# Patient Record
Sex: Female | Born: 1980 | Race: Black or African American | Hispanic: No | Marital: Single | Smoking: Current every day smoker
Health system: Southern US, Community
[De-identification: ages and names within clinical notes are randomized; demographics above are authoritative.]

## PROBLEM LIST (undated history)

## (undated) ENCOUNTER — Ambulatory Visit: Admission: EM | Payer: Self-pay

## (undated) DIAGNOSIS — M329 Systemic lupus erythematosus, unspecified: Secondary | ICD-10-CM

## (undated) DIAGNOSIS — IMO0002 Reserved for concepts with insufficient information to code with codable children: Secondary | ICD-10-CM

## (undated) DIAGNOSIS — F419 Anxiety disorder, unspecified: Secondary | ICD-10-CM

## (undated) DIAGNOSIS — F329 Major depressive disorder, single episode, unspecified: Secondary | ICD-10-CM

## (undated) DIAGNOSIS — F32A Depression, unspecified: Secondary | ICD-10-CM

## (undated) DIAGNOSIS — B009 Herpesviral infection, unspecified: Secondary | ICD-10-CM

## (undated) HISTORY — DX: Systemic lupus erythematosus, unspecified: M32.9

## (undated) HISTORY — DX: Herpesviral infection, unspecified: B00.9

## (undated) HISTORY — DX: Anxiety disorder, unspecified: F41.9

## (undated) HISTORY — PX: MANDIBLE SURGERY: SHX707

---

## 2007-04-23 ENCOUNTER — Emergency Department: Payer: Self-pay | Admitting: Unknown Physician Specialty

## 2008-12-28 DIAGNOSIS — F17210 Nicotine dependence, cigarettes, uncomplicated: Secondary | ICD-10-CM | POA: Insufficient documentation

## 2009-01-19 ENCOUNTER — Ambulatory Visit: Payer: Self-pay | Admitting: Otolaryngology

## 2009-01-25 ENCOUNTER — Ambulatory Visit: Payer: Self-pay | Admitting: Otolaryngology

## 2009-02-01 ENCOUNTER — Ambulatory Visit: Payer: Self-pay | Admitting: Otolaryngology

## 2009-03-19 DIAGNOSIS — R634 Abnormal weight loss: Secondary | ICD-10-CM | POA: Insufficient documentation

## 2009-06-05 ENCOUNTER — Emergency Department: Payer: Self-pay | Admitting: Emergency Medicine

## 2012-02-09 ENCOUNTER — Emergency Department: Payer: Self-pay | Admitting: Emergency Medicine

## 2012-05-24 ENCOUNTER — Emergency Department: Payer: Self-pay | Admitting: Emergency Medicine

## 2012-05-24 LAB — URINALYSIS, COMPLETE
Bacteria: NONE SEEN
Blood: NEGATIVE
Glucose,UR: NEGATIVE mg/dL (ref 0–75)
Nitrite: NEGATIVE
Protein: 30
RBC,UR: 1 /HPF (ref 0–5)
Specific Gravity: 1.02 (ref 1.003–1.030)

## 2012-07-26 ENCOUNTER — Emergency Department: Payer: Self-pay | Admitting: Emergency Medicine

## 2012-07-26 LAB — URINALYSIS, COMPLETE
Bilirubin,UR: NEGATIVE
Leukocyte Esterase: NEGATIVE
Nitrite: NEGATIVE
Ph: 5 (ref 4.5–8.0)
Squamous Epithelial: 3
WBC UR: 3 /HPF (ref 0–5)

## 2012-07-26 LAB — CBC
MCH: 26 pg (ref 26.0–34.0)
MCV: 81 fL (ref 80–100)
Platelet: 197 10*3/uL (ref 150–440)
RBC: 5.14 10*6/uL (ref 3.80–5.20)
WBC: 4.1 10*3/uL (ref 3.6–11.0)

## 2012-07-26 LAB — WET PREP, GENITAL

## 2013-11-28 DIAGNOSIS — L932 Other local lupus erythematosus: Secondary | ICD-10-CM | POA: Insufficient documentation

## 2013-12-31 DIAGNOSIS — F341 Dysthymic disorder: Secondary | ICD-10-CM | POA: Insufficient documentation

## 2013-12-31 LAB — HM PAP SMEAR

## 2016-07-08 ENCOUNTER — Ambulatory Visit
Admission: EM | Admit: 2016-07-08 | Discharge: 2016-07-08 | Disposition: A | Discharge status: Home / Self Care | Payer: Medicaid Other | Attending: Family Medicine | Admitting: Family Medicine

## 2016-07-08 DIAGNOSIS — J069 Acute upper respiratory infection, unspecified: Secondary | ICD-10-CM | POA: Diagnosis present

## 2016-07-08 DIAGNOSIS — F17210 Nicotine dependence, cigarettes, uncomplicated: Secondary | ICD-10-CM | POA: Insufficient documentation

## 2016-07-08 DIAGNOSIS — J029 Acute pharyngitis, unspecified: Secondary | ICD-10-CM | POA: Diagnosis not present

## 2016-07-08 DIAGNOSIS — Z79899 Other long term (current) drug therapy: Secondary | ICD-10-CM | POA: Diagnosis not present

## 2016-07-08 HISTORY — DX: Systemic lupus erythematosus, unspecified: M32.9

## 2016-07-08 HISTORY — DX: Reserved for concepts with insufficient information to code with codable children: IMO0002

## 2016-07-08 LAB — RAPID STREP SCREEN (MED CTR MEBANE ONLY): Streptococcus, Group A Screen (Direct): NEGATIVE

## 2016-07-08 MED ORDER — AZITHROMYCIN 250 MG PO TABS: ORAL_TABLET | ORAL | 0 refills | Status: DC

## 2016-07-08 MED ORDER — BENZONATATE 200 MG PO CAPS: 200.0000 mg | ORAL_CAPSULE | Freq: Three times a day (TID) | ORAL | 0 refills | Status: DC

## 2016-07-08 NOTE — ED Triage Notes (Signed)
Pt c/o sore throat for over a week, loss of voice, and congestion.

## 2016-07-08 NOTE — ED Provider Notes (Signed)
CSN: [id number]     Arrival date & time 07/08/16  0940 History   First MD Initiated Contact with Patient 07/08/16 1219     Chief Complaint  Patient presents with  . Sore Throat   (Consider location/radiation/quality/duration/timing/severity/associated sxs/prior Treatment) HPI  36 year old female since with a sore throat for 4 days week recent loss of her voice and congestion along with cough. For 2 weeks he's had chills cough preceeding the sore throat and loss of voice. She's had no nausea vomiting. She's had no fever. Is been using DayQuil. She is a smoker of 15 cigarettes daily.      Past Medical History:  Diagnosis Date  . Lupus    History reviewed. No pertinent surgical history. Family History  Problem Relation Age of Onset  . Cancer Mother   . Lupus Mother   . Thyroid disease Mother   . Hypertension Father    Social History  Substance Use Topics  . Smoking status: Current Every Day Smoker    Packs/day: 0.50    Types: Cigarettes  . Smokeless tobacco: Never Used  . Alcohol use Yes   OB History    No data available     Review of Systems  Constitutional: Positive for activity change, chills and fatigue. Negative for fever.  HENT: Positive for congestion, postnasal drip and sore throat.   Respiratory: Positive for cough. Negative for shortness of breath, wheezing and stridor.   All other systems reviewed and are negative.   Allergies  Patient has no known allergies.  Home Medications   Prior to Admission medications   Medication Sig Start Date End Date Taking? Authorizing Provider  cetirizine (ZYRTEC) 10 MG tablet Take 10 mg by mouth daily.   Yes Historical Provider, MD  mirtazapine (REMERON) 15 MG tablet Take 15 mg by mouth at bedtime.   Yes Historical Provider, MD  azithromycin (ZITHROMAX Z-PAK) 250 MG tablet Take per package instructions 07/08/16   Lutricia Feil, PA-C  benzonatate (TESSALON) 200 MG capsule Take 1 capsule (200 mg total) by mouth every 8  (eight) hours. 07/08/16   Lutricia Feil, PA-C   Meds Ordered and Administered this Visit  Medications - No data to display  BP (!) 134/92 (BP Location: Left Arm)   Pulse (!) 101   Temp 98.8 F (37.1 C) (Oral)   Resp 20   Ht 5\' 8"  (1.727 m)   Wt 115 lb (52.2 kg)   LMP 06/07/2016   SpO2 98%   BMI 17.49 kg/m  No data found.   Physical Exam  Constitutional: She is oriented to person, place, and time. She appears well-developed and well-nourished. No distress.  HENT:  Head: Normocephalic and atraumatic.  Right Ear: External ear normal.  Left Ear: External ear normal.  Nose: Nose normal.  Mouth/Throat: Oropharynx is clear and moist.  Eyes: EOM are normal. Pupils are equal, round, and reactive to light. Right eye exhibits no discharge. Left eye exhibits no discharge.  Neck: Normal range of motion. Neck supple.  Pulmonary/Chest: Effort normal and breath sounds normal. No respiratory distress. She has no wheezes. She has no rales.  Musculoskeletal: Normal range of motion.  Lymphadenopathy:    She has no cervical adenopathy.  Neurological: She is alert and oriented to person, place, and time.  Skin: Skin is warm and dry. She is not diaphoretic.  Psychiatric: She has a normal mood and affect. Her behavior is normal. Judgment and thought content normal.  Nursing note and vitals reviewed.  Urgent Care Course     Procedures (including critical care time)  Labs Review Labs Reviewed  RAPID STREP SCREEN (NOT AT [facility])  CULTURE, GROUP A STREP [facility])    Imaging Review No results found.   Visual Acuity Review  Right Eye Distance:   Left Eye Distance:   Bilateral Distance:    Right Eye Near:   Left Eye Near:    Bilateral Near:         MDM   1. Upper respiratory tract infection, unspecified type    Discharge Medication List as of 07/08/2016 12:28 PM    START taking these medications   Details  azithromycin (ZITHROMAX Z-PAK) 250 MG tablet Take per package  instructions, Normal    benzonatate (TESSALON) 200 MG capsule Take 1 capsule (200 mg total) by mouth every 8 (eight) hours., Starting Tue 07/08/2016, Normal      Plan: 1. Test/x-ray results and diagnosis reviewed with patient 2. rx as per orders; risks, benefits, potential side effects reviewed with patient 3. Recommend supportive treatment with Rest and fluids. This could possibly a viral illness but this has been lasting over 2 weeks it is worth trying an antibiotic. Follow-up with her primary care physician 4. F/u prn if symptoms worsen or don't improve     Lutricia FeilWilliam P Roemer, PA-C 07/08/16 1238

## 2016-07-11 LAB — CULTURE, GROUP A STREP (THRC)

## 2017-01-02 ENCOUNTER — Ambulatory Visit (INDEPENDENT_AMBULATORY_CARE_PROVIDER_SITE_OTHER): Payer: Self-pay

## 2017-01-02 ENCOUNTER — Ambulatory Visit
Admission: EM | Admit: 2017-01-02 | Discharge: 2017-01-02 | Disposition: A | Discharge status: Home / Self Care | Payer: Self-pay | Attending: Family Medicine | Admitting: Family Medicine

## 2017-01-02 ENCOUNTER — Encounter: Payer: Self-pay | Admitting: *Deleted

## 2017-01-02 DIAGNOSIS — F0781 Postconcussional syndrome: Secondary | ICD-10-CM

## 2017-01-02 DIAGNOSIS — S0083XA Contusion of other part of head, initial encounter: Secondary | ICD-10-CM

## 2017-01-02 DIAGNOSIS — S0003XA Contusion of scalp, initial encounter: Secondary | ICD-10-CM

## 2017-01-02 DIAGNOSIS — W19XXXA Unspecified fall, initial encounter: Secondary | ICD-10-CM

## 2017-01-02 MED ORDER — HYDROCODONE-ACETAMINOPHEN 5-325 MG PO TABS: ORAL_TABLET | ORAL | 0 refills | Status: DC

## 2017-01-02 NOTE — Discharge Instructions (Signed)
Ice, rest, over the counter tylenol or advil Go to Emergency Department if symptoms get worse

## 2017-01-02 NOTE — ED Triage Notes (Signed)
Patient fell at home 6 days ago injuring the left side of her head. Patient complains of dizziness, blurry vision and headache.

## 2017-01-02 NOTE — ED Provider Notes (Signed)
[facility]    CSN: [id number] Arrival date & time: 01/02/17  0847     History   Chief Complaint Chief Complaint  Patient presents with  . Headache    HPI Kelly Hampton is a 36 y.o. female.   36 yo female with a c/o left sided face pain after injuring it 6 days ago. Patient states she was playing with her children, tripped and fell, hitting the left side of her face, head. Denies loss of consciousness, vomiting. States since then she has had intermittent headaches, feeling "foggy". Has been going to work this week and taking over the counter ibuprofen.     The history is provided by the patient.    Past Medical History:  Diagnosis Date  . Lupus     There are no active problems to display for this patient.   History reviewed. No pertinent surgical history.  OB History    No data available       Home Medications    Prior to Admission medications   Medication Sig Start Date End Date Taking? Authorizing Provider  cetirizine (ZYRTEC) 10 MG tablet Take 10 mg by mouth daily.   Yes [provider]  mirtazapine (REMERON) 15 MG tablet Take 15 mg by mouth at bedtime.   Yes [provider]  azithromycin (ZITHROMAX Z-PAK) 250 MG tablet Take per package instructions 07/08/16   Lutricia Feil, PA-C  benzonatate (TESSALON) 200 MG capsule Take 1 capsule (200 mg total) by mouth every 8 (eight) hours. 07/08/16   Lutricia Feil, PA-C  HYDROcodone-acetaminophen (NORCO/VICODIN) 5-325 MG tablet 1-2 tabs po bid prn 01/02/17   Payton Mccallum, MD    Family History Family History  Problem Relation Age of Onset  . Cancer Mother   . Lupus Mother   . Thyroid disease Mother   . Hypertension Father     Social History Social History  Substance Use Topics  . Smoking status: Current Every Day Smoker    Packs/day: 0.50    Types: Cigarettes  . Smokeless tobacco: Never Used  . Alcohol use Yes     Allergies   Patient has no known  allergies.   Review of Systems Review of Systems   Physical Exam Triage Vital Signs ED Triage Vitals  Enc Vitals Group     BP 01/02/17 0905 (!) 120/91     Pulse Rate 01/02/17 0905 91     Resp 01/02/17 0905 16     Temp 01/02/17 0905 98.7 F (37.1 C)     Temp Source 01/02/17 0905 Oral     SpO2 01/02/17 0905 100 %     Weight --      Height --      Head Circumference --      Peak Flow --      Pain Score 01/02/17 0909 7     Pain Loc --      Pain Edu? --      Excl. in GC? --    No data found.   Updated Vital Signs BP (!) 120/91 (BP Location: Left Arm)   Pulse 91   Temp 98.7 F (37.1 C) (Oral)   Resp 16   LMP 12/19/2016 Comment: denies preg  SpO2 100%   Visual Acuity Right Eye Distance: 20/25 Left Eye Distance: 20/25 Bilateral Distance: 20/25  Right Eye Near:   Left Eye Near:    Bilateral Near:     Physical Exam  Constitutional: She is oriented to person, place,  and time. She appears well-developed and well-nourished. No distress.  HENT:  Head: Normocephalic and atraumatic.    Right Ear: Tympanic membrane, external ear and ear canal normal.  Left Ear: Tympanic membrane, external ear and ear canal normal.  Nose: No mucosal edema, rhinorrhea, nose lacerations, sinus tenderness, nasal deformity, septal deviation or nasal septal hematoma. No epistaxis.  No foreign bodies. Right sinus exhibits no maxillary sinus tenderness and no frontal sinus tenderness. Left sinus exhibits no maxillary sinus tenderness and no frontal sinus tenderness.  Mouth/Throat: Uvula is midline, oropharynx is clear and moist and mucous membranes are normal. No oropharyngeal exudate.  No skin lesions or deformities; tenderness to palpation over the left lateral zygomatic arch and jaw area  Eyes: Pupils are equal, round, and reactive to light. Conjunctivae and EOM are normal. Right eye exhibits no discharge. Left eye exhibits no discharge. No scleral icterus.  Neck: Normal range of motion. Neck  supple. No thyromegaly present.  Cardiovascular: Normal rate, regular rhythm and normal heart sounds.   Pulmonary/Chest: Effort normal and breath sounds normal. No respiratory distress. She has no wheezes. She has no rales.  Lymphadenopathy:    She has no cervical adenopathy.  Neurological: She is alert and oriented to person, place, and time. She displays normal reflexes. No cranial nerve deficit or sensory deficit. She exhibits normal muscle tone. Coordination normal.  Non-focal neurologic exam  Skin: She is not diaphoretic.  Psychiatric: She has a normal mood and affect. Her behavior is normal. Judgment and thought content normal.  Nursing note and vitals reviewed.    UC Treatments / Results  Labs (all labs ordered are listed, but only abnormal results are displayed) Labs Reviewed - No data to display  EKG  EKG Interpretation None       Radiology Dg Mandible 4 Views  Result Date: 01/02/2017 CLINICAL DATA:  Status post fall with injury to the left side of the face. EXAM: MANDIBLE - 4+ VIEW COMPARISON:  None. FINDINGS: There is no evidence of fracture or other focal bone lesions. Multiple metallic foreign bodies are seen overlying the face and head. IMPRESSION: Negative. Electronically Signed   By: Ted Mcalpineobrinka  Dimitrova M.D.   On: 01/02/2017 10:12   Dg Facial Bones Complete  Result Date: 01/02/2017 CLINICAL DATA:  Left-sided facial swelling, post fall. EXAM: FACIAL BONES COMPLETE 3+V COMPARISON:  None. FINDINGS: There is no evidence of fracture or other significant bone abnormality. No orbital emphysema or sinus air-fluid levels are seen. Multiple metallic foreign bodies overlie the films. IMPRESSION: Negative. Electronically Signed   By: Ted Mcalpineobrinka  Dimitrova M.D.   On: 01/02/2017 10:13    Procedures Procedures (including critical care time)  Medications Ordered in UC Medications - No data to display   Initial Impression / Assessment and Plan / UC Course  I have reviewed the  triage vital signs and the nursing notes.  Pertinent labs & imaging results that were available during my care of the patient were reviewed by me and considered in my medical decision making (see chart for details).       Final Clinical Impressions(s) / UC Diagnoses   Final diagnoses:  Facial contusion, initial encounter  Contusion of scalp, initial encounter  Post concussion syndrome    New Prescriptions Discharge Medication List as of 01/02/2017 10:33 AM    START taking these medications   Details  HYDROcodone-acetaminophen (NORCO/VICODIN) 5-325 MG tablet 1-2 tabs po bid prn, Print       1. x-ray results (negative) and diagnosis  reviewed with patient 2. rx as per orders above; reviewed possible side effects, interactions, risks and benefits  3. Recommend supportive treatment with otc analgesics prn 4. Follow-up prn if symptoms worsen or don't improve   Payton Mccallum, MD 01/02/17 1432

## 2017-09-26 ENCOUNTER — Other Ambulatory Visit: Payer: Self-pay

## 2017-09-26 ENCOUNTER — Ambulatory Visit
Admission: EM | Admit: 2017-09-26 | Discharge: 2017-09-26 | Disposition: A | Discharge status: Home / Self Care | Payer: Self-pay | Attending: Family Medicine | Admitting: Family Medicine

## 2017-09-26 DIAGNOSIS — F17210 Nicotine dependence, cigarettes, uncomplicated: Secondary | ICD-10-CM

## 2017-09-26 DIAGNOSIS — R102 Pelvic and perineal pain: Secondary | ICD-10-CM

## 2017-09-26 HISTORY — DX: Major depressive disorder, single episode, unspecified: F32.9

## 2017-09-26 HISTORY — DX: Depression, unspecified: F32.A

## 2017-09-26 LAB — URINALYSIS, COMPLETE (UACMP) WITH MICROSCOPIC
BACTERIA UA: NONE SEEN
Bilirubin Urine: NEGATIVE
Glucose, UA: NEGATIVE mg/dL
Ketones, ur: NEGATIVE mg/dL
Leukocytes, UA: NEGATIVE
Nitrite: NEGATIVE
PROTEIN: NEGATIVE mg/dL
Specific Gravity, Urine: <1.005 — ABNORMAL LOW (ref 1.005–1.030)
pH: 5.5 (ref 5.0–8.0)

## 2017-09-26 NOTE — ED Provider Notes (Signed)
[facility] ____________________________________________  Time seen: Approximately 10:00 AM  I have reviewed the triage vital signs and the nursing notes.   HISTORY  Chief Complaint Abdominal Pain   HPI Kelly Hampton is a 37 y.o. female presented for evaluation of acute onset of left lower pelvic pain that started earlier this morning.  Patient states that she goes into work at 5 AM, reports at 5:30 AM she began having left lower pain that has persisted.  States initially it was causing her to be doubled over in pain, states slightly better now but the pain has been constant.  States currently on menstrual cycle. States pain currently 7 out of 10.  Denies any pain radiation.  Last bowel movement was today and described as normal.  Denies any urinary frequency, urinary urgency, vaginal discharge, back pain, fevers, sore throat or rash.  States sexually active with the same partner.  States not highly concerned about STDs, but does have some concern.  Denies history of this similar in the past.  Reports does have 2 teenage children.  Denies concern of pregnancy.  States has Implanon.  Denies trauma or injury.  Does report that she does a lot of lifting at work, but denies any acute onset during lifting or change in activity levels.  Reports otherwise feels well. Denies recent sickness. Denies recent antibiotic use.   [facility]: PCP   Past Medical History:  Diagnosis Date  . Depression   . Lupus (HCC)     There are no active problems to display for this patient.   Past Surgical History:  Procedure Laterality Date  . MANDIBLE SURGERY       No current facility-administered medications for this encounter.   Current Outpatient Medications:  .  clobetasol cream (TEMOVATE) 0.05 %, Apply 1 application topically 2 (two) times daily., Disp: , Rfl:  .  etonogestrel (NEXPLANON) 68 MG IMPL implant, 1 each by Subdermal route once., Disp: , Rfl:  .   cetirizine (ZYRTEC) 10 MG tablet, Take 10 mg by mouth daily., Disp: , Rfl:  .  mirtazapine (REMERON) 15 MG tablet, Take 15 mg by mouth at bedtime., Disp: , Rfl:   Allergies Patient has no known allergies.  Family History  Problem Relation Age of Onset  . Cancer Mother   . Lupus Mother   . Thyroid disease Mother   . Hypertension Father     Social History Social History   Tobacco Use  . Smoking status: Current Every Day Smoker    Packs/day: 0.50    Types: Cigarettes  . Smokeless tobacco: Never Used  Substance Use Topics  . Alcohol use: Yes    Comment: social  . Drug use: Not Currently    Review of Systems Constitutional: No fever/chills Cardiovascular: Denies chest pain. Respiratory: Denies shortness of breath. Gastrointestinal: As above.  No nausea, no vomiting.  No diarrhea.  No constipation. Genitourinary: Negative for dysuria. Musculoskeletal: Negative for back pain. Skin: Negative for rash.  ____________________________________________   PHYSICAL EXAM:  VITAL SIGNS: ED Triage Vitals  Enc Vitals Group     BP 09/26/17 0914 123/89     Pulse Rate 09/26/17 0914 100     Resp 09/26/17 0914 16     Temp 09/26/17 0914 98.6 F (37 C)     Temp Source 09/26/17 0914 Oral     SpO2 09/26/17 0914 98 %     Weight 09/26/17 0915 111 lb (50.3 kg)     Height 09/26/17  0915 5\' 8"  (1.727 m)     Head Circumference --      Peak Flow --      Pain Score 09/26/17 0915 7     Pain Loc --      Pain Edu? --      Excl. in GC? --     Constitutional: Alert and oriented. Well appearing and in no acute distress. ENT      Head: Normocephalic and atraumatic. Cardiovascular: Normal rate, regular rhythm. Grossly normal heart sounds.  Good peripheral circulation. Respiratory: Normal respiratory effort without tachypnea nor retractions. Breath sounds are clear and equal bilaterally. No wheezes, rales, rhonchi. Gastrointestinal: No distention. Normal Bowel sounds. Moderate tenderness point  left pelvic adnexal area, abdomen otherwise soft and nontender.  No CVA tenderness. Musculoskeletal:  No midline cervical, thoracic or lumbar tenderness to palpation.  Neurologic:  Normal speech and language. No gross focal neurologic deficits are appreciated. Speech is normal. No gait instability.  Skin:  Skin is warm, dry and intact. No rash noted. Psychiatric: Mood and affect are normal. Speech and behavior are normal. Patient exhibits appropriate insight and judgment   ___________________________________________   LABS (all labs ordered are listed, but only abnormal results are displayed)  Labs Reviewed  URINALYSIS, COMPLETE (UACMP) WITH MICROSCOPIC - Abnormal; Notable for the following components:      Result Value   Specific Gravity, Urine <1.005 (*)    Hgb urine dipstick MODERATE (*)    Squamous Epithelial / LPF 0-5 (*)    All other components within normal limits     PROCEDURES Procedures  INITIAL IMPRESSION / ASSESSMENT AND PLAN / ED COURSE  Pertinent labs & imaging results that were available during my care of the patient were reviewed by me and considered in my medical decision making (see chart for details).  Well-appearing patient.  No acute distress.  Presenting for a left lower abdominal complaints, patient with point left pelvic tenderness, concern for adnexal etiology.  Discussed differentials including STDs, ovarian cyst as well as ovarian torsion.  Urinalysis reviewed, suspect contaminated from menstrual bleeding, otherwise unremarkable.  Discussed in detail with patient and recommend that patient needs to have further evaluated including a pelvic ultrasound.  No ultrasound availability at this facility at this time.  Recommend for patient to have further evaluation in emergency room, patient states that she [facility].  Patient stable at time of discharge.  Patient verbalized understanding and agreed to plan.    ____________________________________________   FINAL CLINICAL IMPRESSION(S) / ED DIAGNOSES  Final diagnoses:  Pelvic pain     ED Discharge Orders    None       Note: This dictation was prepared with Dragon dictation along with smaller phrase technology. Any transcriptional errors that result from this process are unintentional.         Renford Dills, NP 09/26/17 1020

## 2017-09-26 NOTE — ED Triage Notes (Addendum)
Pt started this left lower quad abdominal pain starting this a.m. No n/v/d. Pain 7/10. Started menses on Monday

## 2017-09-26 NOTE — Discharge Instructions (Addendum)
Go directly to Emergency room as discussed.  °

## 2019-01-11 ENCOUNTER — Ambulatory Visit: Payer: Self-pay

## 2019-01-17 ENCOUNTER — Other Ambulatory Visit: Payer: Self-pay

## 2019-01-17 ENCOUNTER — Ambulatory Visit: Payer: Self-pay | Admitting: Physician Assistant

## 2019-01-17 ENCOUNTER — Encounter: Payer: Self-pay | Admitting: Physician Assistant

## 2019-01-17 DIAGNOSIS — Z113 Encounter for screening for infections with a predominantly sexual mode of transmission: Secondary | ICD-10-CM

## 2019-01-17 DIAGNOSIS — B009 Herpesviral infection, unspecified: Secondary | ICD-10-CM

## 2019-01-17 LAB — WET PREP FOR TRICH, YEAST, CLUE
Trichomonas Exam: NEGATIVE
Yeast Exam: NEGATIVE

## 2019-01-17 LAB — HM HIV SCREENING LAB: HM HIV Screening: NEGATIVE

## 2019-01-17 MED ORDER — ACYCLOVIR 400 MG PO TABS: 400.0000 mg | ORAL_TABLET | Freq: Three times a day (TID) | ORAL | 0 refills | Status: DC

## 2019-01-17 NOTE — Progress Notes (Signed)
    STI clinic/screening visit  Subjective:  Kelly Hampton is a 38 y.o. female being seen today for an STI screening visit. Kelly patient reports they do not have symptoms.  Patient has Kelly following medical conditions:  There are no active problems to display for this patient.    Chief Complaint  Patient presents with  . SEXUALLY TRANSMITTED DISEASE    HPI  Patient reports that does not have any symptoms.  States that she found out that her partner has other partners.  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    See flowsheet for further details and programmatic requirements.    Kelly following portions of Kelly patient's history were reviewed and updated as appropriate: allergies, current medications, past medical history, past social history, past surgical history and problem list.  Objective:  There were no vitals filed for this visit.  Physical Exam Constitutional:      General: She is not in acute distress.    Appearance: Normal appearance.  HENT:     Head: Normocephalic and atraumatic.     Mouth/Throat:     Mouth: Mucous membranes are moist.     Pharynx: Oropharynx is clear. No oropharyngeal exudate or posterior oropharyngeal erythema.  Neck:     Musculoskeletal: Neck supple.  Pulmonary:     Effort: Pulmonary effort is normal.  Abdominal:     Palpations: Abdomen is soft. There is no mass.     Tenderness: There is no abdominal tenderness. There is no guarding or rebound.  Genitourinary:    General: Normal vulva.     Rectum: Normal.     Comments: External genitalia/pubic area without nits, lice, edema, erythema, lesions and inguinal adenopathy. Vaginal mucosa and discharge normal  Cervix without visible lesions. Uterus normal size, firm, mobile, nt, no CMT, no masses, no adnexal tenderness or fullness.  Lymphadenopathy:     Cervical: No cervical adenopathy.  Skin:    General: Skin is warm and dry.     Findings: No bruising, erythema, lesion or rash.   Neurological:     Mental Status: She is alert and oriented to person, place, and time.  Psychiatric:        Mood and Affect: Mood normal.        Behavior: Behavior normal.        Thought Content: Thought content normal.        Judgment: Judgment normal.       Assessment and Plan:  Lamonica Trueba is a 38 y.o. female presenting to [facility] Department for STI screening  1. Screening for STD (sexually transmitted disease) Patient is without symptoms today. Patient requests Acyclovir to have if she has an outbreak and given Acyclovir 400mg  #15 1 po tid for 5 days if needed Rec condoms with all sex Await test results.  Counseled that RN will call if needs to RTC for treatment once results are back.  - WET PREP FOR [place], YEAST, [place] [facility] - Syphilis Serology, [facility]     No follow-ups on file.  No future appointments.  Jerene Dilling, PA

## 2020-04-18 ENCOUNTER — Other Ambulatory Visit: Payer: Self-pay

## 2020-04-18 ENCOUNTER — Encounter: Payer: Self-pay | Admitting: Family Medicine

## 2020-04-18 ENCOUNTER — Ambulatory Visit: Payer: Self-pay | Admitting: Family Medicine

## 2020-04-18 VITALS — BP 129/93 | Ht 67.0 in | Wt 103.0 lb

## 2020-04-18 DIAGNOSIS — Z3009 Encounter for other general counseling and advice on contraception: Secondary | ICD-10-CM

## 2020-04-18 DIAGNOSIS — Z3046 Encounter for surveillance of implantable subdermal contraceptive: Secondary | ICD-10-CM

## 2020-04-18 DIAGNOSIS — B009 Herpesviral infection, unspecified: Secondary | ICD-10-CM

## 2020-04-18 NOTE — Progress Notes (Signed)
Pt is here for physical and Nexplanon removal and reinsertion. Pt reports last sex was 04/05/2020. Pt reports is satisfied with the Nexplanon as her BCM. Pt reports Nex[facility]on was placed at [facility] in 03/[facility]. RN counseling for Nexplanon removal and reinsertion completed, consult completed and consent forms reviewed and signed by pt. Pt filling out PHQ9.

## 2020-04-18 NOTE — Progress Notes (Signed)
ROI for Kelly Hampton reviewed and signed by pt for physical and pap records per provider verbal order. ROI faxed and fax confirmation received. Provider orders completed.

## 2020-04-19 MED ORDER — ACYCLOVIR 400 MG PO TABS: 400.0000 mg | ORAL_TABLET | Freq: Three times a day (TID) | ORAL | 11 refills | Status: DC

## 2020-04-19 MED ORDER — ETONOGESTREL 68 MG ~~LOC~~ IMPL: 68.0000 mg | DRUG_IMPLANT | Freq: Once | SUBCUTANEOUS | Status: DC

## 2020-04-19 MED ORDER — ETONOGESTREL 68 MG ~~LOC~~ IMPL
68.0000 mg | DRUG_IMPLANT | Freq: Once | SUBCUTANEOUS | Status: AC
  Administered 2020-04-19: 68 mg via SUBCUTANEOUS

## 2020-04-19 NOTE — Progress Notes (Signed)
[facility] DEPARTMENT [facility] [address] Main Number: [phone]    Family Planning Visit- Initial Visit  Subjective:  Kelly Hampton is a 39 y.o.  [id number]   being seen today for an initial well woman visit and to discuss family planning options.  She is currently using Nexplanon for pregnancy prevention. Patient reports she does not want a pregnancy in the next year.  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  Chief Complaint  Patient presents with  . Contraception    Physical and Nexplanon removal and reinsertion    Patient reports here for nexplanon removal and reinsertion   Patient denies any problems with previous device.  Patient   Body mass index is 16.13 kg/m. - Patient is eligible for diabetes screening based on BMI and age >31?  no HA1C ordered? not applicable  Patient reports 1 of partners in last year. Desires STI screening?  No - no s/sx or new partners since last screening   Has patient been screened once for HCV in the past?  Yes  No results found for: HCVAB  Does the patient have current drug use (including MJ), have a partner with drug use, and/or has been incarcerated since last result? No  If yes-- Screen for HCV through [facility]   Does the patient meet criteria for HBV testing? No  Criteria:  -Household, sexual or needle sharing contact with HBV -History of drug use -HIV positive -Those with known Hep C   Health Maintenance Due  Topic Date Due  . Hepatitis C Screening  Never done  . COVID-19 Vaccine (1) Never done  . TETANUS/TDAP  Never done  . PAP SMEAR-Modifier  Never done  . INFLUENZA VACCINE  Never done    Review of Systems  Skin:       Malar rash on right side of face and nose, hyperpigmented - from lupus   All other systems reviewed and are negative.   The following portions of the patient's history were reviewed and updated as  appropriate: allergies, current medications, past family history, past medical history, past social history, past surgical history and problem list. Problem list updated.   See flowsheet for other program required questions.  Objective:   Vitals:   04/18/20 1012  BP: (!) 129/93  Weight: 103 lb (46.7 kg)  Height: 5\' 7"  (1.702 m)    Physical Exam Vitals and nursing note reviewed. Exam conducted with a chaperone present.  Constitutional:      Appearance: Normal appearance.  HENT:     Head: Normocephalic.      Mouth/Throat:     Mouth: Mucous membranes are moist.     Pharynx: Oropharynx is clear. No oropharyngeal exudate or posterior oropharyngeal erythema.  Cardiovascular:     Rate and Rhythm: Normal rate and regular rhythm.     Pulses: Normal pulses.     Heart sounds: Normal heart sounds.  Pulmonary:     Effort: Pulmonary effort is normal.     Breath sounds: Normal breath sounds.  Abdominal:     General: Abdomen is flat.     Palpations: Abdomen is soft. There is no mass.     Tenderness: There is no abdominal tenderness. There is no guarding or rebound.     Hernia: No hernia is present.  Genitourinary:    Comments: External genitalia without, lice, nits, erythema, edema , lesions or inguinal adenopathy. Vagina with normal mucosa and discharge  and pH equals 4.  Cervix without visual lesions, uterus firm, mobile, non-tender, no masses, CMT adnexal fullness or tenderness.   Musculoskeletal:        General: Normal range of motion.     Cervical back: Normal range of motion and neck supple.  Lymphadenopathy:     Cervical: No cervical adenopathy.  Skin:    General: Skin is warm and dry.  Neurological:     Mental Status: She is alert and oriented to person, place, and time.  Psychiatric:        Mood and Affect: Mood normal.        Behavior: Behavior normal.       Assessment and Plan:  Kelly Hampton is a 39 y.o. female presenting to the [facility] Department  for an initial well woman exam/family planning visit  Contraception counseling: Reviewed all forms of birth control options in the tiered based approach. available including abstinence; over the counter/barrier methods; hormonal contraceptive medication including pill, patch, ring, injection,contraceptive implant, ECP; hormonal and nonhormonal IUDs; permanent sterilization options including vasectomy and the various tubal sterilization modalities. Risks, benefits, and typical effectiveness rates were reviewed.  Questions were answered.  Written information was also given to the patient to review.  Patient desires nexplanon, this was prescribed for patient. She will follow up in  1 year for surveillance.  She was told to call with any further questions, or with any concerns about this method of contraception.  Emphasized use of condoms 100% of the time for STI prevention.  Patient was offered ECP. ECP was not accepted by the patient. ECP counseling was not given - see RN documentation  1. HSV-2 infection  needs refill for acyclovir for episodic therapy.  Refill sent to [facility] in [place].      2. Family planning counseling - IGP, Aptima HPV Pap completed today awaiting results.  Patient to be informed if any problems or concerns.    Discussed with patient about returning to PCP at [facility] to follow up on her Lupus. Starting to have malar rash appear on right side of cheek and nose and to get back on medications, she reports stopped using facial cream when she ran out.     ROI to be sent to [facility]  for previous pap and PE   4. Encounter for removal and reinsertion of Nexplanon Nexplanon Removal and Insertion  Patient identified, informed consent performed, consent signed.   Patient does understand that irregular bleeding is a very common side effect of this medication. She was advised to have backup contraception for one week after replacement of the implant. Patient deemed to meet WHO criteria for  being reasonably certain she is not pregnant.  Appropriate time out taken. Nexplanon site identified. Area prepped in usual sterile fashon. 3 ml of 1% lidocaine with epinephrine was used to anesthetize the area at the distal end of the implant. A small stab incision was made right beside the implant on the distal portion. The Nexplanon rod was grasped using hemostats and removed without difficulty. There was minimal blood loss. There were no complications.   Confirmed correct location of insertion site. The insertion site was identified 8-10 cm (3-4 inches) from the medial epicondyle of the humerus and 3-5 cm (1.25-2 inches) posterior to (below) the sulcus (groove) between the biceps and triceps muscles of the patient's left arm. New Nexplanon removed from packaging, Device confirmed in needle, then inserted full length of needle and withdrawn per handbook instructions. Nexplanon  was able to palpated in the patient's left arm; patient palpated the insert herself.  There was minimal blood loss. Patient insertion site covered with guaze and a pressure bandage to reduce any bruising. The patient tolerated the procedure well and was given post procedure instructions.    Counseled patient to take OTC analgesic starting as soon as lidocaine starts to wear off and take regularly for at least 48 hr to decrease discomfort.  Specifically to take with food or milk to decrease stomach upset and for IB 600 mg (3 tablets) every 6 hrs; IB 800 mg (4 tablets) every 8 hrs; or Aleve 2 tablets every 12 hrs.     Return in 1 year (on 04/18/2021) for annual and PRN.  No future appointments.  Wendi Snipes, FNP

## 2020-04-21 LAB — IGP, APTIMA HPV
HPV Aptima: NEGATIVE
PAP Smear Comment: 0

## 2020-05-13 ENCOUNTER — Encounter: Payer: Self-pay | Admitting: Physician Assistant

## 2021-06-27 ENCOUNTER — Ambulatory Visit
Admission: RE | Admit: 2021-06-27 | Discharge: 2021-06-27 | Disposition: A | Discharge status: Home / Self Care | Payer: Worker's Compensation | Attending: Physician Assistant | Admitting: Physician Assistant

## 2021-06-27 ENCOUNTER — Other Ambulatory Visit: Payer: Self-pay | Admitting: Physician Assistant

## 2021-06-27 ENCOUNTER — Ambulatory Visit
Admission: RE | Admit: 2021-06-27 | Discharge: 2021-06-27 | Disposition: A | Discharge status: Home / Self Care | Payer: Worker's Compensation | Source: Ambulatory Visit | Attending: Physician Assistant | Admitting: Physician Assistant

## 2021-06-27 DIAGNOSIS — R52 Pain, unspecified: Secondary | ICD-10-CM

## 2021-06-27 DIAGNOSIS — R609 Edema, unspecified: Secondary | ICD-10-CM | POA: Insufficient documentation

## 2021-11-13 ENCOUNTER — Encounter: Payer: Self-pay | Admitting: Emergency Medicine

## 2021-11-13 ENCOUNTER — Ambulatory Visit
Admission: EM | Admit: 2021-11-13 | Discharge: 2021-11-13 | Disposition: A | Discharge status: Home / Self Care | Payer: Self-pay | Attending: Physician Assistant | Admitting: Physician Assistant

## 2021-11-13 DIAGNOSIS — M542 Cervicalgia: Secondary | ICD-10-CM

## 2021-11-13 DIAGNOSIS — R200 Anesthesia of skin: Secondary | ICD-10-CM

## 2021-11-13 MED ORDER — BACLOFEN 10 MG PO TABS: 10.0000 mg | ORAL_TABLET | Freq: Three times a day (TID) | ORAL | 0 refills | Status: DC | PRN

## 2021-11-13 NOTE — Discharge Instructions (Signed)
-  EKG without any significant concerns. - Blood pressure is a bit elevated. - Work on decreasing her dietary sodium and getting exercise. - Make an appointment with your primary care provider at [facility] for lab work and discuss potential referral to a specialist to help manage your lupus.  May also need to return to a dermatologist for help with the rash. - I think your neck pain and numbness today is related to pinched nerve in your neck and muscle spasms.  Continue ibuprofen and Tylenol.  Consider application of heat, ice, muscle rubs, lidocaine patch.  I have also sent a muscle relaxer. - You should be feeling better over the next 2 days but if you do not you may need to follow-up with [facility] for imaging of her neck. - Go to emergency department if your left arm becomes weak, you have significantly worsening neck pain or difficulty moving neck, severe headaches, vision changes, nausea/vomiting, balance or speech issues, confusion, chest pain, palpitations, shortness of breath, sweats or weakness.  NECK PAIN: Stressed avoiding painful activities. This can exacerbate your symptoms and make them worse.  May apply heat to the areas of pain for some relief. Use medications as directed. Be aware of which medications make you drowsy and do not drive or operate any kind of heavy machinery while using the medication (ie pain medications or muscle relaxers). F/U with PCP for reexamination or return sooner if condition worsens or does not begin to improve over the next few days.   NECK PAIN RED FLAGS: If symptoms get worse than they are right now, you should come back sooner for re-evaluation. If you have increased numbness/ tingling or notice that the numbness/tingling is affecting the legs or saddle region, go to ER. If you ever lose continence go to ER.

## 2021-11-13 NOTE — ED Provider Notes (Signed)
[facility]    CSN: [id number] Arrival date & time: 11/13/21  1006      History   Chief Complaint Chief Complaint  Patient presents with   Left Sided Numbness    HPI Kelly Hampton is a 41 y.o. female presenting for 2 to 3-day history of left lateral neck pain and numbness as well as pain and numbness radiating to the upper left shoulder.  She denies any injuries but is very physical at her job.  Patient denies any associated weakness.  Has not had any headaches, vision changes, facial numbness or droop, syncope or presyncope, confusion, speech or balance problems, chest pain, palpitations, shortness of breath.  No lower extremity numbness, weakness or tingling.  Patient denies any history of neck problems.  She reports increased pain when she rotates her neck to the right side.  She says it feels very tight and cramping on the left side.  Medical history significant for lupus.  Patient also is a current every day smoker.  She denies any personal history of heart attack or stroke.  Patient's partner is present and confirms she is acting like her normal self.  She has tried ibuprofen and Tylenol and says that it does help the neck discomfort.  HPI  Past Medical History:  Diagnosis Date   Depression    Herpes simplex virus (HSV) infection    Lupus ([place])    Systemic lupus erythematosus ([place])     Patient Active Problem List   Diagnosis Date Noted   HSV-2 infection 01/17/2019    Past Surgical History:  Procedure Laterality Date   MANDIBLE SURGERY      OB History     Gravida  5   Para  2   Term  2   Preterm      AB  3   Living  2      SAB      IAB      Ectopic      Multiple      Live Births               Home Medications    Prior to Admission medications   Medication Sig Start Date End Date Taking? Authorizing Provider  baclofen (LIORESAL) 10 MG tablet Take 1 tablet (10 mg total) by mouth 3 (three) times daily as needed for muscle  spasms. 11/13/21  Yes Danton Clap, PA-C  etonogestrel (NEXPLANON) 68 MG IMPL implant 1 each by Subdermal route once.   Yes [provider]  acyclovir (ZOVIRAX) 400 MG tablet Take 1 tablet (400 mg total) by mouth 3 (three) times daily. 04/19/20   Junious Dresser, FNP  cetirizine (ZYRTEC) 10 MG tablet Take 10 mg by mouth daily.    [provider]  clobetasol cream (TEMOVATE) [id number] % Apply 1 application topically 2 (two) times daily.    [provider]  mirtazapine (REMERON) 15 MG tablet Take 15 mg by mouth at bedtime.    [provider]    Family History Family History  Problem Relation Age of Onset   Cancer Mother    Lupus Mother    Thyroid disease Mother    Hypertension Father    Heart murmur Father    Anxiety disorder Sister    Sickle cell trait Niece     Social History Social History   Tobacco Use   Smoking status: Every Day    Packs/day: 0.50    Types: Cigarettes   Smokeless tobacco: Never  Substance Use Topics   Alcohol use: Yes    Comment: social   Drug use: Not Currently     Allergies   Patient has no known allergies.   Review of Systems Review of Systems  Constitutional:  Negative for fatigue.  Eyes:  Negative for visual disturbance.  Respiratory:  Negative for shortness of breath.   Cardiovascular:  Negative for chest pain and palpitations.  Gastrointestinal:  Negative for abdominal pain, nausea and vomiting.  Musculoskeletal:  Positive for neck pain and neck stiffness. Negative for back pain.  Neurological:  Positive for numbness. Negative for dizziness, tremors, seizures, syncope, facial asymmetry, speech difficulty, weakness, light-headedness and headaches.  Psychiatric/Behavioral:  Negative for confusion.     Physical Exam Triage Vital Signs ED Triage Vitals  Enc Vitals Group     BP      Pulse      Resp      Temp      Temp src      SpO2      Weight      Height      Head Circumference      Peak Flow       Pain Score      Pain Loc      Pain Edu?      Excl. in [place]?    No data found.  Updated Vital Signs BP (!) 143/103 (BP Location: Left Arm)   Pulse 93   Temp 98.2 F (36.8 C) (Oral)   Resp 18   Ht 5\' 8"  (1.727 m)   Wt 113 lb (51.3 kg)   SpO2 100%   BMI 17.18 kg/m      Physical Exam Vitals and nursing note reviewed.  Constitutional:      General: She is not in acute distress.    Appearance: Normal appearance. She is not ill-appearing or toxic-appearing.  HENT:     Head: Normocephalic and atraumatic.     Nose: Nose normal.     Mouth/Throat:     Mouth: Mucous membranes are moist.     Pharynx: Oropharynx is clear.  Eyes:     General: No scleral icterus.       Right eye: No discharge.        Left eye: No discharge.     Extraocular Movements: Extraocular movements intact.     Conjunctiva/sclera: Conjunctivae normal.     Pupils: Pupils are equal, round, and reactive to light.  Cardiovascular:     Rate and Rhythm: Normal rate and regular rhythm.     Pulses: Normal pulses.     Heart sounds: Normal heart sounds.  Pulmonary:     Effort: Pulmonary effort is normal. No respiratory distress.     Breath sounds: Normal breath sounds. No wheezing, rhonchi or rales.  Musculoskeletal:     Cervical back: Neck supple. Tenderness (left SCM) present. No bony tenderness. Pain with movement present. Decreased range of motion (with rotation to the left).     Comments: Full ROM of shoulder on left. 5/5 strength bilateral upper and lower extremities  Skin:    General: Skin is dry.  Neurological:     General: No focal deficit present.     Mental Status: She is alert and oriented to person, place, and time. Mental status is at baseline.     Cranial Nerves: No cranial nerve deficit.     Motor: No weakness.     Coordination: Coordination normal.     Gait: Gait normal.  Psychiatric:        Mood and Affect: Mood normal.        Behavior: Behavior normal.        Thought Content: Thought content  normal.     UC Treatments / Results  Labs (all labs ordered are listed, but only abnormal results are displayed) Labs Reviewed - No data to display  EKG   Radiology No results found.  Procedures ED EKG  Date/Time: 11/13/2021 12:03 PM Performed by: Danton Clap, PA-C Authorized by: Danton Clap, PA-C   ECG reviewed by ED Physician in the absence of a cardiologist: yes   Previous ECG:    Previous ECG:  Unavailable Interpretation:    Interpretation: abnormal   Rate:    ECG rate:  81   ECG rate assessment: normal   Rhythm:    Rhythm: sinus rhythm   Ectopy:    Ectopy: none   QRS:    QRS axis:  Normal   QRS intervals:  Normal   QRS conduction: normal   ST segments:    ST segments:  Normal T waves:    T waves: non-specific   Comments:     Normal sinus rhythm.  Nonspecific T wave changes (including critical care time)  Medications Ordered in UC Medications - No data to display  Initial Impression / Assessment and Plan / UC Course  I have reviewed the triage vital signs and the nursing notes.  Pertinent labs & imaging results that were available during my care of the patient were reviewed by me and considered in my medical decision making (see chart for details).  41 year old female with history of SLE presents to urgent care for 2 to 3-day history of left anterior/lateral neck pain and numbness with radiation to the top of the left shoulder.  No reported injuries but patient is very physical at her job.  She denies any associated severe headaches, vision changes, speech or balance problems, confusion, numbness/tingling/weakness of any other part of the body.  She also has not had any facial drooping.  Denies chest pain, palpitations or breathing difficulty.  No history of heart attack or stroke.  Has been taking ibuprofen and Tylenol which have helped some.  Initial BP is 157/105. BP recheck is 143/103.  Patient denies history of hypertension but reports she does  not have a PCP.  Has not been seen in some time.  Does not take anything for her SLE other than topical corticosteroids for butterfly rash.  The remainder of vital signs are normal and stable.  She is overall well-appearing and in good spirits.  On exam she does have tenderness palpation along the SCM.  Normal cranial nerve exam and 5 out of 5 strength bilateral upper and lower extremities.  Slightly reduced range of motion of the neck with rotation to the right as this causes her pain.  EKG performed today shows normal sinus rhythm and regular rate of 81 bpm.  Nonspecific T wave changes, inversion of T waves in V2, V4 and aVL.  Patient's clinical presentation consistent with muscle spasms and suspected pinched nerve/cervical radiculopathy.  Discussed with patient that there were some mild abnormalities in her EKG.  She is denying any associated chest pain, palpitations or breathing difficulty and symptoms have not worsened over the past couple of days.  Also denies any signs/symptoms of stroke and exam is overall reassuring.  She has tenderness palpation along the left side of the neck where she is experiencing the numbness.  Full range of motion of extremities and 5 out of 5 strength of bilateral upper and lower extremities.  Normal cranial nerve exam.  Chest clear to auscultation heart regular rate and rhythm.  We will treat patient at this time with NSAIDs, Tylenol, lidocaine patches, heat, ice and also provide her with baclofen.  Work note provided.  Thoroughly reviewed ED precautions with her and advised her to go to the ER if she has left arm weakness or worsening pain or difficulty moving neck, severe headaches, vision changes, nausea/vomiting, balance or speech problems, confusion, chest pains, palpitations, shortness of breath, sweats or weakness.  Patient agrees to plan.  Also advised her to follow-up with [facility] for a physical exam.   Final Clinical Impressions(s) / UC Diagnoses   Final  diagnoses:  Neck pain  Left arm numbness     Discharge Instructions      -EKG without any significant concerns. - Blood pressure is a bit elevated. - Work on decreasing her dietary sodium and getting exercise. - Make an appointment with your primary care provider at [facility] for lab work and discuss potential referral to a specialist to help manage your lupus.  May also need to return to a dermatologist for help with the rash. - I think your neck pain and numbness today is related to pinched nerve in your neck and muscle spasms.  Continue ibuprofen and Tylenol.  Consider application of heat, ice, muscle rubs, lidocaine patch.  I have also sent a muscle relaxer. - You should be feeling better over the next 2 days but if you do not you may need to follow-up with [facility] for imaging of her neck. - Go to emergency department if your left arm becomes weak, you have significantly worsening neck pain or difficulty moving neck, severe headaches, vision changes, nausea/vomiting, balance or speech issues, confusion, chest pain, palpitations, shortness of breath, sweats or weakness.  NECK PAIN: Stressed avoiding painful activities. This can exacerbate your symptoms and make them worse.  May apply heat to the areas of pain for some relief. Use medications as directed. Be aware of which medications make you drowsy and do not drive or operate any kind of heavy machinery while using the medication (ie pain medications or muscle relaxers). F/U with PCP for reexamination or return sooner if condition worsens or does not begin to improve over the next few days.   NECK PAIN RED FLAGS: If symptoms get worse than they are right now, you should come back sooner for re-evaluation. If you have increased numbness/ tingling or notice that the numbness/tingling is affecting the legs or saddle region, go to ER. If you ever lose continence go to ER.         ED Prescriptions     Medication Sig Dispense Auth.  Provider   baclofen (LIORESAL) 10 MG tablet Take 1 tablet (10 mg total) by mouth 3 (three) times daily as needed for muscle spasms. 30 each Gretta Cool      PDMP not reviewed this encounter.   Danton Clap, PA-C 11/13/21 1207

## 2021-11-13 NOTE — ED Notes (Incomplete)
Patient presents to Urgent Care with complaints of numbness to left side of neck to shoulder area since Sunday at 2100. Pt unsure if she slept on her arm wrong. Pt c/o of nausea and vomitign since Sunday. Treating symptoms with pepto.   Denies chest pain, SOB, changes in vision, speech, or changes in gait.

## 2021-11-13 NOTE — ED Triage Notes (Signed)
Patient presents to [facility] for numbness to her left neck and shoulder area since Sunday at about 2200. She states she is unsure if she slept on it wrong. Also c/o of n/v since Sunday. Treating symptoms with OTC tums.   Denies changes in gait, vision, speech, chest pain, SOB.

## 2021-11-13 NOTE — ED Triage Notes (Signed)
Patient c/o left sided facial and left sided numbness x 2 days.  No history of HTN, no blurred vision, no chest pain.

## 2022-04-11 ENCOUNTER — Ambulatory Visit (INDEPENDENT_AMBULATORY_CARE_PROVIDER_SITE_OTHER): Payer: No Typology Code available for payment source

## 2022-04-11 ENCOUNTER — Encounter: Payer: Self-pay | Admitting: Emergency Medicine

## 2022-04-11 ENCOUNTER — Ambulatory Visit
Admission: EM | Admit: 2022-04-11 | Discharge: 2022-04-11 | Disposition: A | Discharge status: Home / Self Care | Payer: No Typology Code available for payment source | Attending: Emergency Medicine | Admitting: Emergency Medicine

## 2022-04-11 DIAGNOSIS — M25521 Pain in right elbow: Secondary | ICD-10-CM

## 2022-04-11 DIAGNOSIS — J309 Allergic rhinitis, unspecified: Secondary | ICD-10-CM | POA: Insufficient documentation

## 2022-04-11 DIAGNOSIS — M25421 Effusion, right elbow: Secondary | ICD-10-CM | POA: Diagnosis present

## 2022-04-11 LAB — BASIC METABOLIC PANEL
Anion gap: 4 — ABNORMAL LOW (ref 5–15)
BUN: 8 mg/dL (ref 6–20)
CO2: 27 mmol/L (ref 22–32)
Calcium: 8.8 mg/dL — ABNORMAL LOW (ref 8.9–10.3)
Chloride: 106 mmol/L (ref 98–111)
Creatinine, Ser: 0.67 mg/dL (ref 0.44–1.00)
GFR, Estimated: >60 mL/min (ref >60)
Glucose, Bld: 96 mg/dL (ref 70–99)
Potassium: 4 mmol/L (ref 3.5–5.1)
Sodium: 137 mmol/L (ref 135–145)

## 2022-04-11 LAB — SEDIMENTATION RATE: Sed Rate: 3 mm/hr (ref 0–20)

## 2022-04-11 MED ORDER — FLUTICASONE PROPIONATE 50 MCG/ACT NA SUSP: 2.0000 | Freq: Every day | NASAL | 0 refills | Status: DC

## 2022-04-11 MED ORDER — CETIRIZINE HCL 10 MG PO TABS: 10.0000 mg | ORAL_TABLET | Freq: Every day | ORAL | 0 refills | Status: DC

## 2022-04-11 MED ORDER — AMOXICILLIN-POT CLAVULANATE 875-125 MG PO TABS: 1.0000 | ORAL_TABLET | Freq: Two times a day (BID) | ORAL | 0 refills | Status: DC

## 2022-04-11 NOTE — ED Triage Notes (Signed)
Pt c/o nasal congestion, post nasal drip, sinus pain/pressure and headache. Started back in April. She has tried allergy medication. She also c/o right elbow pain and swelling. Started about a year ago. She states the cant use it like she wants to.

## 2022-04-11 NOTE — Discharge Instructions (Addendum)
Schedule appt with [facility] practice Office [phone]    Follow-up with Dr. Zigmund Daniel, sports medicine, ASAP.  I have put in an urgent referral to him.  I will contact you if and only if your sedimentation rate comes back abnormal.  You can try an Ace wrap on your elbow and take 400 mg of ibuprofen combined with 1000 mg of Tylenol 3 times a day.  If you are unable to see Dr. Zigmund Daniel in a timely fashion, follow-up with [facility].  Continue the Zyrtec and finish the Augmentin.  Use a NeilMed sinus rinse with distilled water as often as you want to to reduce nasal congestion. Follow the directions on the box.   Go to www.goodrx.com to look up your medications. This will give you a list of where you can find your prescriptions at the most affordable prices. Or you can ask the pharmacist what the cash price is. This is frequently cheaper than going through insurance.

## 2022-04-11 NOTE — ED Provider Notes (Signed)
HPI  SUBJECTIVE:  Kelly Hampton is a right-handed 41 y.o. female who presents with 2 issues: First, she reports right elbow pain for 1 to 1-1/2 years.  She reports intermittent elbow swelling.  She states that she cannot use her elbow properly secondary to the pain.  It has not changed over the past few weeks.  No recent trauma, joint erythema, distal numbness or tingling.  She states that her shoulder and wrist are fine.  She has tried elevation, exercise and rest.  Symptoms are better with movement, worse in the morning and with full extension/flexion beyond 90 degrees.  Second, she reports 2 months of maxillary sinus pain and pressure, postnasal drip, allergy symptoms of itchy, watery eyes, sneezing, nasal congestion worse in the morning.  She reports 2 days of a sinus headache.  She is exposed to dust at work.  She has been taking Benadryl 50 mg 3 times a day with improvement in her symptoms.  She also states that Zyrtec and Flonase help.  There are no aggravating factors.  She has a past medical history of lupus, denies renal or pulmonary involvement.  No history of rheumatoid arthritis, osteoarthritis, right elbow injury, diabetes, hypertension.  Family history significant for grandmother with rheumatoid arthritis.  LMP: She is irregular.  Has a Nexplanon.  Denies possibility of being pregnant.  PCP: None.   Past Medical History:  Diagnosis Date   Depression    Herpes simplex virus (HSV) infection    Lupus ([place])    Systemic lupus erythematosus ([place])     Past Surgical History:  Procedure Laterality Date   MANDIBLE SURGERY      Family History  Problem Relation Age of Onset   Cancer Mother    Lupus Mother    Thyroid disease Mother    Hypertension Father    Heart murmur Father    Anxiety disorder Sister    Sickle cell trait Niece     Social History   Tobacco Use   Smoking status: Every Day    Packs/day: 0.50    Types: Cigarettes   Smokeless tobacco: Never  Vaping Use    Vaping Use: Never used  Substance Use Topics   Alcohol use: Yes    Comment: social   Drug use: Not Currently    No current facility-administered medications for this encounter.  Current Outpatient Medications:    amoxicillin-clavulanate (AUGMENTIN) 875-125 MG tablet, Take 1 tablet by mouth every 12 (twelve) hours., Disp: 14 tablet, Rfl: 0   cetirizine (ZYRTEC ALLERGY) 10 MG tablet, Take 1 tablet (10 mg total) by mouth daily., Disp: 30 tablet, Rfl: 0   etonogestrel (NEXPLANON) 68 MG IMPL implant, 1 each by Subdermal route once., Disp: , Rfl:    fluticasone (FLONASE) 50 MCG/ACT nasal spray, Place 2 sprays into both nostrils daily., Disp: 16 g, Rfl: 0   acyclovir (ZOVIRAX) 400 MG tablet, Take 1 tablet (400 mg total) by mouth 3 (three) times daily., Disp: 15 tablet, Rfl: 11   baclofen (LIORESAL) 10 MG tablet, Take 1 tablet (10 mg total) by mouth 3 (three) times daily as needed for muscle spasms., Disp: 30 each, Rfl: 0   clobetasol cream (TEMOVATE) 7.67 %, Apply 1 application topically 2 (two) times daily., Disp: , Rfl:    mirtazapine (REMERON) 15 MG tablet, Take 15 mg by mouth at bedtime., Disp: , Rfl:   No Known Allergies   ROS  As noted in HPI.   Physical Exam  BP (!) 119/90 (BP Location: Left  Arm)   Pulse (!) 107   Temp 98.4 F (36.9 C) (Oral)   Resp 16   Ht 5\' 8"  (1.727 m)   Wt 51.3 kg   SpO2 100%   BMI 17.20 kg/m   Constitutional: Well developed, well nourished, no acute distress Eyes:  EOMI, conjunctiva normal bilaterally HENT: Normocephalic, atraumatic,mucus membranes moist.  Erythematous, swollen turbinates.  Mucoid nasal discharge.  Positive mild maxillary sinus tenderness.  No obvious postnasal drip. Respiratory: Normal inspiratory effort Cardiovascular: Normal rate GI: nondistended skin: No rash, skin intact Musculoskeletal: Right elbow ROM slightly decreased, unable to fully extend / Unable to flex to 90 degrees, joint slightly swollen, no erythema, no bursa  swelling, supracondylar region tender, Radial head NT , Olecrenon process  tender, Medial epicondyle NT, Lateral epicondyle NT,  Shoulder NT, Wrist NT, Hand NT with distal NVI CR<2secs, radial pulse intact, Sensation LT and Motor intact distally in distribution of radial, median, and ulnar nerve function.  Neurologic: Alert & oriented x 3, no focal neuro deficits Psychiatric: Speech and behavior appropriate   ED Course   Medications - No data to display  Orders Placed This Encounter  Procedures   DG Elbow Complete Right    Standing Status:   Standing    Number of Occurrences:   1    Order Specific Question:   Reason for Exam (SYMPTOM  OR DIAGNOSIS REQUIRED)    Answer:   R elbow pain swelling x 1 year r/o acute changes, effusion   Basic metabolic panel    Standing Status:   Standing    Number of Occurrences:   1   Sedimentation rate    Standing Status:   Standing    Number of Occurrences:   1   AMB referral to sports medicine    Referral Priority:   Urgent    Referral Type:   Consultation    Referred to Provider:   Montel Culver, MD    Number of Visits Requested:   1   Nursing Communication Please set up with a PCP prior to discharge    Please set up with a PCP prior to discharge    Standing Status:   Standing    Number of Occurrences:   1    Results for orders placed or performed during the hospital encounter of 04/11/22 (from the past 24 hour(s))  Basic metabolic panel     Status: Abnormal   Collection Time: 04/11/22  2:26 PM  Result Value Ref Range   Sodium 137 135 - 145 mmol/L   Potassium 4.0 3.5 - 5.1 mmol/L   Chloride 106 98 - 111 mmol/L   CO2 27 22 - 32 mmol/L   Glucose, Bld 96 70 - 99 mg/dL   BUN 8 6 - 20 mg/dL   Creatinine, Ser 0.67 0.44 - 1.00 mg/dL   Calcium 8.8 (L) 8.9 - 10.3 mg/dL   GFR, Estimated >60 >60 mL/min   Anion gap 4 (L) 5 - 15  Sedimentation rate     Status: None   Collection Time: 04/11/22  2:26 PM  Result Value Ref Range   Sed Rate 3 0 -  20 mm/hr   DG Elbow Complete Right  Result Date: 04/11/2022 CLINICAL DATA:  Right elbow pain and swelling for 1 year. Evaluate for acute changes. Evaluate for effusion. EXAM: RIGHT ELBOW - COMPLETE 3+ VIEW COMPARISON:  None Available. FINDINGS: Normal bone mineralization. There is elevation of the distal anterior humeral fat pad and there is  visualization of the posterior fat pad indicating a moderate to high-grade joint effusion. No definite acute fracture is seen. No dislocation. IMPRESSION: Moderate to high-grade elbow joint effusion. No definite acute fracture is seen. Per the indication, it does not appear the patient has had recent trauma. Recommend clinical correlation as to whether there is clinical concern for a radiographically occult fracture. Electronically Signed   By: Yvonne Kendall M.D.   On: 04/11/2022 14:38    ED Clinical Impression  1. Elbow effusion, right   2. Allergic sinusitis      ED Assessment/Plan    1.  Chronic right elbow pain.  Patient states her elbow has been swollen for several months.  Will x-ray to rule out any acute changes.  We will check BMP and a sed rate given history of lupus.  In the differential is autoimmune arthritis, osteoarthritis, overuse, gout.  Doubt septic joint, gout given duration of symptoms.  It does not appear to be a bursitis.  Reviewed imaging independently.  moderate high-grade elbow joint effusion..  See radiology report for details.  Concern for autoimmune arthritis such as rheumatoid arthritis.  I arranged an appointment with Dr. Rosette Reveal, sports medicine on Monday, 10/30 at 11 AM.  Discussed this with patient and wrote this down on her discharge papers.  2.  Allergic rhinitis/maxillary sinusitis.  We will send home with Zyrtec 10 mg daily, saline nasal irrigation, Flonase, 2 actuations each nostril daily, and Augmentin 875 mg twice daily for 7 days.  She is to follow-up with family practice in [place].  We have arranged an  appointment prior to discharge.  Discussed labs, imaging, MDM, treatment plan, and plan for follow-up with patient.  patient agrees with plan.   Meds ordered this encounter  Medications   cetirizine (ZYRTEC ALLERGY) 10 MG tablet    Sig: Take 1 tablet (10 mg total) by mouth daily.    Dispense:  30 tablet    Refill:  0   fluticasone (FLONASE) 50 MCG/ACT nasal spray    Sig: Place 2 sprays into both nostrils daily.    Dispense:  16 g    Refill:  0   amoxicillin-clavulanate (AUGMENTIN) 875-125 MG tablet    Sig: Take 1 tablet by mouth every 12 (twelve) hours.    Dispense:  14 tablet    Refill:  0      *This clinic note was created using [employer]. Therefore, there may be occasional mistakes despite careful proofreading.  ?    Melynda Ripple, MD 04/12/22 [phone]

## 2022-04-14 ENCOUNTER — Encounter: Payer: Self-pay | Admitting: Family Medicine

## 2022-04-24 ENCOUNTER — Encounter: Payer: Self-pay | Admitting: Family Medicine

## 2022-04-24 ENCOUNTER — Ambulatory Visit: Payer: No Typology Code available for payment source | Admitting: Family Medicine

## 2022-04-24 VITALS — BP 132/82 | HR 110 | Ht 68.0 in | Wt 102.0 lb

## 2022-04-24 DIAGNOSIS — M329 Systemic lupus erythematosus, unspecified: Secondary | ICD-10-CM | POA: Diagnosis not present

## 2022-04-24 DIAGNOSIS — M25812 Other specified joint disorders, left shoulder: Secondary | ICD-10-CM | POA: Insufficient documentation

## 2022-04-24 DIAGNOSIS — M25422 Effusion, left elbow: Secondary | ICD-10-CM

## 2022-04-24 DIAGNOSIS — M25529 Pain in unspecified elbow: Secondary | ICD-10-CM | POA: Diagnosis not present

## 2022-04-24 DIAGNOSIS — M7542 Impingement syndrome of left shoulder: Secondary | ICD-10-CM

## 2022-04-24 DIAGNOSIS — M25429 Effusion, unspecified elbow: Secondary | ICD-10-CM | POA: Diagnosis not present

## 2022-04-24 HISTORY — DX: Other specified joint disorders, left shoulder: M25.812

## 2022-04-24 MED ORDER — DICLOFENAC SODIUM 50 MG PO TBEC: 50.0000 mg | DELAYED_RELEASE_TABLET | Freq: Two times a day (BID) | ORAL | 0 refills | Status: DC | PRN

## 2022-04-24 NOTE — Progress Notes (Signed)
Primary Care / Sports Medicine Office Visit  Patient Information:  Patient ID: Kelly Hampton, female DOB: 06/02/1981 Age: 41 y.o. MRN: [id number]   Andres Escandon is a pleasant 41 y.o. female presenting with the following:  Chief Complaint  Patient presents with   Elbow Pain    Left elbow, swells up and down at times.     Vitals:   04/24/22 1529  BP: 132/82  Pulse: (!) 110  SpO2: 98%   Vitals:   04/24/22 1529  Weight: 102 lb (46.3 kg)  Height: 5\' 8"  (1.727 m)   Body mass index is 15.51 kg/m.  DG Elbow Complete Right  Result Date: 04/11/2022 CLINICAL DATA:  Right elbow pain and swelling for 1 year. Evaluate for acute changes. Evaluate for effusion. EXAM: RIGHT ELBOW - COMPLETE 3+ VIEW COMPARISON:  None Available. FINDINGS: Normal bone mineralization. There is elevation of the distal anterior humeral fat pad and there is visualization of the posterior fat pad indicating a moderate to high-grade joint effusion. No definite acute fracture is seen. No dislocation. IMPRESSION: Moderate to high-grade elbow joint effusion. No definite acute fracture is seen. Per the indication, it does not appear the patient has had recent trauma. Recommend clinical correlation as to whether there is clinical concern for a radiographically occult fracture. Electronically Signed   By: 04/13/2022 M.D.   On: 04/11/2022 14:38     Independent interpretation of notes and tests performed by another provider:   None  Procedures performed:   None  Pertinent History, Exam, Impression, and Recommendations:   Problem List Items Addressed This Visit       Other   Lupus ([facility])    Chronic condition noted on patient's chart, patient relays at the same, unknown work-up.  To her knowledge, primarily dermatologic manifestations.  Given atraumatic recurrent right elbow pain and swelling, lack of PCP, plan for referral to rheumatology for further evaluation and management of this issue.  Additionally our  office will try to help patient coordinate establishing with local primary care provider due to transportation issues.      Relevant Orders   Ambulatory referral to Rheumatology   Nontraumatic pain and swelling of elbow - Primary    Right-hand-dominant patient presenting with atraumatic right elbow pain and swelling, occurring roughly twice per month over the past few years.  Does have a stated comorbid history of lupus.  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  That has since resolved with time and relative rest.  Examination today reveals full and symmetric painless range of motion with flexion and extension, pronation/supination.  Painless click appreciated during maximal extension, nontender at the triceps insertion, biceps, lateral and medial epicondyles, provocative testing including varus/valgus stressing, epicondylitis testing, all benign.  Sensorimotor intact and symmetric to contralateral elbow.  Given her x-ray findings, reassuring examination today, though with comorbid medical history and chronicity of symptoms, symptoms can be tied to overuse related arthralgia with associated effusion, internal derangement, sequela of autoimmune condition.  Plan for topical NSAID trial, low threshold to advance to oral diclofenac, compression brace, home-based [facility] elbow conditioning program, and I have advised the patient to contact 04/13/2022 for recalcitrant symptoms.  Additionally, referral to rheumatology has been placed, see additional assessment(s) for plan details.      Impingement of left shoulder    Right-hand-dominant patient presenting with 5-20-month history of atraumatic left posterior  shoulder pain, did have numbness at onset throughout the left upper extremity, has since resolved with relative rest, as needed baclofen.  Examination reveals full painless range of motion of  the shoulder, resisted rotator cuff testing with pain during isolated supraspinatus and external rotation, 5/5 strength, positive impingement, negative Spurling's test, provocative testing at the shoulder otherwise benign.  Patient with rotator cuff based tendinitis and associate impingement, topical NSAID advised, low threshold to advance to diclofenac oral, [facility] shoulder conditioning program materials provided.  She can contact us for recalcitrant symptoms and otherwise follow-up as needed.        Orders & Medications Meds ordered this encounter  Medications   diclofenac (VOLTAREN) 50 MG EC tablet    Sig: Take 1 tablet (50 mg total) by mouth 2 (two) times daily as needed.    Dispense:  60 tablet    Refill:  0   Orders Placed This Encounter  Procedures   Ambulatory referral to Rheumatology     Return if symptoms worsen or fail to improve.     Jerrol Banana, MD   Primary Care Sports Medicine [place] [facility]

## 2022-04-24 NOTE — Assessment & Plan Note (Signed)
Right-hand-dominant patient presenting with 5-18-month history of atraumatic left posterior shoulder pain, did have numbness at onset throughout the left upper extremity, has since resolved with relative rest, as needed baclofen.  Examination reveals full painless range of motion of the shoulder, resisted rotator cuff testing with pain during isolated supraspinatus and external rotation, 5/5 strength, positive impingement, negative Spurling's test, provocative testing at the shoulder otherwise benign.  Patient with rotator cuff based tendinitis and associate impingement, topical NSAID advised, low threshold to advance to diclofenac oral, [facility] shoulder conditioning program materials provided.  She can contact us for recalcitrant symptoms and otherwise follow-up as needed.

## 2022-04-24 NOTE — Patient Instructions (Signed)
-   Use topical diclofenac 1% (Voltaren gel) to elbow and left shoulder up to 4 times a day as needed - If ineffective, use diclofenac oral tablet twice daily as needed - Can trial Lidoderm patch to shoulder - Start home exercises for elbow and shoulder - Referral coordinator will contact you to schedule rheumatology visit - Contact us for any persistent elbow/shoulder symptoms despite the above - Follow-up as needed

## 2022-04-24 NOTE — Assessment & Plan Note (Signed)
Right-hand-dominant patient presenting with atraumatic right elbow pain and swelling, occurring roughly twice per month over the past few years.  Does have a stated comorbid history of lupus.  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  That has since resolved with time and relative rest.  Examination today reveals full and symmetric painless range of motion with flexion and extension, pronation/supination.  Painless click appreciated during maximal extension, nontender at the triceps insertion, biceps, lateral and medial epicondyles, provocative testing including varus/valgus stressing, epicondylitis testing, all benign.  Sensorimotor intact and symmetric to contralateral elbow.  Given her x-ray findings, reassuring examination today, though with comorbid medical history and chronicity of symptoms, symptoms can be tied to overuse related arthralgia with associated effusion, internal derangement, sequela of autoimmune condition.  Plan for topical NSAID trial, low threshold to advance to oral diclofenac, compression brace, home-based [facility] elbow conditioning program, and I have advised the patient to contact us for recalcitrant symptoms.  Additionally, referral to rheumatology has been placed, see additional assessment(s) for plan details.

## 2022-04-24 NOTE — Assessment & Plan Note (Signed)
Chronic condition noted on patient's chart, patient relays at the same, unknown work-up.  To her knowledge, primarily dermatologic manifestations.  Given atraumatic recurrent right elbow pain and swelling, lack of PCP, plan for referral to rheumatology for further evaluation and management of this issue.  Additionally our office will try to help patient coordinate establishing with local primary care provider due to transportation issues.

## 2022-05-21 ENCOUNTER — Other Ambulatory Visit: Payer: Self-pay | Admitting: Family Medicine

## 2022-05-21 NOTE — Telephone Encounter (Signed)
Requested medication (s) are due for refill today:yes  Requested medication (s) are on the active medication list:yes  Last refill:  04/24/22  Future visit scheduled: yes  Notes to clinic:  Unable to refill per protocol due to failed labs, no updated results.      Requested Prescriptions  Pending Prescriptions Disp Refills   diclofenac (VOLTAREN) 50 MG EC tablet [Pharmacy Med Name: DICLOFENAC SOD EC 50 MG TAB] 60 tablet 0    Sig: TAKE 1 TABLET BY MOUTH 2 TIMES DAILY AS NEEDED.     Analgesics:  NSAIDS Failed - 05/21/2022  1:38 AM      Failed - Manual Review: Labs are only required if the patient has taken medication for more than 8 weeks.      Failed - HGB in normal range and within 360 days    HGB  Date Value Ref Range Status  07/26/2012 13.3 12.0 - 16.0 g/dL Final         Failed - PLT in normal range and within 360 days    Platelet  Date Value Ref Range Status  07/26/2012 197 150 - 440 x10 3/mm 3 Final         Failed - HCT in normal range and within 360 days    HCT  Date Value Ref Range Status  07/26/2012 41.4 35.0 - 47.0 % Final         Failed - Valid encounter within last 12 months    Recent Outpatient Visits           3 weeks ago Nontraumatic pain and swelling of elbow   [facility], Earley Abide, MD       Future Appointments             In 5 months Baity, Coralie Keens, NP [facility] [place] in normal range and within 360 days    Creatinine, Ser  Date Value Ref Range Status  04/11/2022 0.67 0.44 - 1.00 mg/dL Final         Passed - eGFR is 30 or above and within 360 days    GFR, Estimated  Date Value Ref Range Status  04/11/2022 >60 >60 mL/min Final    Comment:    (NOTE) Calculated using the CKD-EPI Creatinine Equation (2021)          Passed - Patient is not pregnant

## 2022-05-28 ENCOUNTER — Ambulatory Visit: Payer: Self-pay

## 2022-05-28 NOTE — Telephone Encounter (Signed)
  Chief Complaint: left shoulder blade, left neck pain and left side of left shoulder Symptoms: muscle spasms Frequency: had episode and was seen in ED in ED on 11/13/21 Pertinent Negatives: Patient denies numbness or weakness to left arm or hand  Disposition: [] ED /[] Urgent Care (no appt availability in office) / [x] Appointment(In office/virtual)/ []  [facility]/ [] Home Care/ [] Refused Recommended Disposition /[]  Mobile Bus/ []  Follow-up with PCP Additional Notes: pt called and re-requested Diclofenac refill (tablet) asked why it was not refilled.  Reason for Disposition  [1] SEVERE pain AND [2] not improved 2 hours after pain medicine  Answer Assessment - Initial Assessment Questions 1. ONSET: "When did the pain begin?"      chronic 2. LOCATION: "Where does it hurt?" (upper, mid or lower back)     Left shoulder and neck  3. SEVERITY: "How bad is the pain?"  (e.g., Scale 1-10; mild, moderate, or severe)   - MILD (1-3): Doesn't interfere with normal activities.    - MODERATE (4-7): Interferes with normal activities or awakens from sleep.    - SEVERE (8-10): Excruciating pain, unable to do any normal activities.      9/10 4. PATTERN: "Is the pain constant?" (e.g., yes, no; constant, intermittent)      yes 5. RADIATION: "Does the pain shoot into your legs or somewhere else?"     no 6. CAUSE:  "What do you think is causing the back pain?"      Work related 7. 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 MEDICINES: "What have you taken so far for the pain?" (e.g., nothing, acetaminophen, NSAIDS)     Diclonus out it was refused-- OTC Tylenol 9. NEUROLOGIC SYMPTOMS: "Do you have any weakness, numbness, or problems with bowel/bladder control?"     Numbness left side of shoulder 10. OTHER SYMPTOMS: "Do you have any other symptoms?" (e.g., fever, abdomen pain, burning with urination,  blood in urine)       no 11. PREGNANCY: "Is there any chance you are pregnant?" "When was your last menstrual period?"       N/a  Answer Assessment - Initial Assessment Questions 1. ONSET: "When did the pain start?"    2 weeks previous episode in 11/13/21 2. LOCATION: "Where is the pain located?"     Left shoulder blade to left side of neck to left side of shoulder 3. PAIN: "How bad is the pain?" (Scale 1-10; or mild, moderate, severe)   - MILD (1-3): doesn't interfere with normal activities   - MODERATE (4-7): interferes with normal activities (e.g., work or school) or awakens from sleep   - SEVERE (8-10): excruciating pain, unable to do any normal activities, unable to move arm at all due to pain     8 4. 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 CAUSE: "What do you think is causing the shoulder pain?"     Her work activity 6. OTHER SYMPTOMS: "Do you have any other symptoms?" (e.g., neck pain, swelling, rash, fever, numbness, weakness)     Left neck  7. PREGNANCY: "Is there any chance you are pregnant?" "When was your last menstrual period?"     N/a  Protocols used: Back Pain-A-AH, Shoulder Pain-A-AH

## 2022-05-29 NOTE — Telephone Encounter (Signed)
noted 

## 2022-06-02 ENCOUNTER — Ambulatory Visit: Payer: No Typology Code available for payment source | Admitting: Family Medicine

## 2022-06-05 ENCOUNTER — Encounter: Payer: Self-pay | Admitting: Family Medicine

## 2022-06-05 ENCOUNTER — Inpatient Hospital Stay: Payer: Self-pay | Admitting: Radiology

## 2022-06-05 ENCOUNTER — Ambulatory Visit
Admission: RE | Admit: 2022-06-05 | Discharge: 2022-06-05 | Disposition: A | Discharge status: Home / Self Care | Payer: No Typology Code available for payment source | Attending: Family Medicine | Admitting: Family Medicine

## 2022-06-05 ENCOUNTER — Ambulatory Visit
Admission: RE | Admit: 2022-06-05 | Discharge: 2022-06-05 | Disposition: A | Discharge status: Home / Self Care | Payer: No Typology Code available for payment source | Source: Ambulatory Visit | Attending: Family Medicine | Admitting: Family Medicine

## 2022-06-05 ENCOUNTER — Ambulatory Visit: Payer: No Typology Code available for payment source | Admitting: Family Medicine

## 2022-06-05 VITALS — BP 128/88 | HR 100 | Ht 68.0 in | Wt 100.0 lb

## 2022-06-05 DIAGNOSIS — M25812 Other specified joint disorders, left shoulder: Secondary | ICD-10-CM

## 2022-06-05 DIAGNOSIS — M7542 Impingement syndrome of left shoulder: Secondary | ICD-10-CM | POA: Diagnosis not present

## 2022-06-05 DIAGNOSIS — M542 Cervicalgia: Secondary | ICD-10-CM | POA: Diagnosis present

## 2022-06-05 MED ORDER — METHOCARBAMOL 500 MG PO TABS: 500.0000 mg | ORAL_TABLET | Freq: Every evening | ORAL | 0 refills | Status: DC | PRN

## 2022-06-05 MED ORDER — DULOXETINE HCL 30 MG PO CPEP: 30.0000 mg | ORAL_CAPSULE | Freq: Every day | ORAL | 0 refills | Status: DC

## 2022-06-05 MED ORDER — MELOXICAM 15 MG PO TABS: 15.0000 mg | ORAL_TABLET | Freq: Every day | ORAL | 0 refills | Status: DC

## 2022-06-05 MED ORDER — GABAPENTIN 100 MG PO CAPS: 100.0000 mg | ORAL_CAPSULE | Freq: Every day | ORAL | 0 refills | Status: DC

## 2022-06-05 NOTE — Patient Instructions (Addendum)
You have just been given a cortisone injection to reduce pain and inflammation. After the injection you may notice immediate relief of pain as a result of the Lidocaine. It is important to rest the area of the injection for 24 to 48 hours after the injection. There is a possibility of some temporary increased discomfort and swelling for up to 72 hours until the cortisone begins to work. If you do have pain, simply rest the joint and use ice. If you can tolerate over the counter medications, you can try Tylenol, Aleve, or Advil for added relief per package instructions. - Full rest x 2 days, can return to work Wednesday 12/27 - Start meloxicam daily with food - Start gabapentin nightly - Start duloxetine nightly - Can take methocarbamol (muscle relaxer) nightly as-needed - Start physical therapy by calling this number to schedule: [place]:  [phone] - Return in 2 months

## 2022-06-12 NOTE — Progress Notes (Signed)
AVS printed and mailed to address on file.  KP

## 2022-06-16 NOTE — Progress Notes (Signed)
     Primary Care / Sports Medicine Office Visit  Patient Information:  Patient ID: Kelly Hampton, female DOB: 1980/07/05 Age: 42 y.o. MRN: [id number]   Kelly Hampton is a pleasant 42 y.o. female presenting with the following:  Chief Complaint  Patient presents with   Shoulder Pain    Left, doesn't think it is muscle    Vitals:   06/05/22 1449  BP: 128/88  Pulse: 100  SpO2: 98%   Vitals:   06/05/22 1449  Weight: 100 lb (45.4 kg)  Height: 5\' 8"  (1.727 m)   Body mass index is 15.2 kg/m.     Independent interpretation of notes and tests performed by another provider:   None  Procedures performed:   Procedure:  Injection of left subacromial shoulder under ultrasound guidance. Ultrasound guidance utilized for in-plane approach, no discreet tendinopathy noted Samsung HS60 device utilized with permanent recording / reporting. Verbal informed consent obtained and verified. Skin prepped in a sterile fashion. Ethyl chloride for topical local analgesia.  Completed without difficulty and tolerated well. Medication: triamcinolone acetonide 40 mg/mL suspension for injection 1 mL total and 2 mL lidocaine 1% without epinephrine utilized for needle placement anesthetic Advised to contact for fevers/chills, erythema, induration, drainage, or persistent bleeding.   Pertinent History, Exam, Impression, and Recommendations:   Problem List Items Addressed This Visit       Other   Impingement of left shoulder - Primary    Focal impingement findings on exam in the setting of cervicalgia. In an effort to address both issues the patient did elect to proceed with ultrasound-guided subacromial injection with cortisone. See additional assessment(s) for plan details.      Relevant Medications   meloxicam (MOBIC) 15 MG tablet   Other Relevant Orders   [place] LIMITED JOINT SPACE STRUCTURES UP LEFT   Cervicalgia    Chronic condition, comorbid left shoulder pain addressed with cortisone,  medication management and x-rays advised, will schedule follow-up to review x-rays and assess response.      Relevant Medications   methocarbamol (ROBAXIN) 500 MG tablet   gabapentin (NEURONTIN) 100 MG capsule   Other Relevant Orders   DG Cervical Spine Complete (Completed)     Orders & Medications Meds ordered this encounter  Medications   methocarbamol (ROBAXIN) 500 MG tablet    Sig: Take 1 tablet (500 mg total) by mouth at bedtime as needed for muscle spasms.    Dispense:  60 tablet    Refill:  0   gabapentin (NEURONTIN) 100 MG capsule    Sig: Take 1 capsule (100 mg total) by mouth at bedtime.    Dispense:  60 capsule    Refill:  0   meloxicam (MOBIC) 15 MG tablet    Sig: Take 1 tablet (15 mg total) by mouth daily.    Dispense:  60 tablet    Refill:  0   DULoxetine (CYMBALTA) 30 MG capsule    Sig: Take 1 capsule (30 mg total) by mouth daily.    Dispense:  60 capsule    Refill:  0   Orders Placed This Encounter  Procedures   [place] LIMITED JOINT SPACE STRUCTURES UP LEFT   DG Cervical Spine Complete     Return in about 2 months (around 08/06/2022).     Montel Culver, MD, [facility]   Primary Care Sports Medicine Primary Care and Sports Medicine at [facility]

## 2022-06-16 NOTE — Assessment & Plan Note (Signed)
Chronic condition, comorbid left shoulder pain addressed with cortisone, medication management and x-rays advised, will schedule follow-up to review x-rays and assess response.

## 2022-06-16 NOTE — Assessment & Plan Note (Signed)
Focal impingement findings on exam in the setting of cervicalgia. In an effort to address both issues the patient did elect to proceed with ultrasound-guided subacromial injection with cortisone. See additional assessment(s) for plan details.

## 2022-06-30 ENCOUNTER — Ambulatory Visit: Payer: No Typology Code available for payment source | Admitting: Physical Therapy

## 2022-07-27 ENCOUNTER — Other Ambulatory Visit: Payer: Self-pay | Admitting: Family Medicine

## 2022-07-28 NOTE — Telephone Encounter (Signed)
Requested medication (s) are due for refill today: yes  Requested medication (s) are on the active medication list: yes  Last refill:  06/05/22 #60  Future visit scheduled: yes  Notes to clinic:  pt has not established as a new pt- pt canceled- Pt sees Dr. Zigmund Daniel for sports medicine. Was concerned if can refill since pt not taking regularly   Requested Prescriptions  Pending Prescriptions Disp Refills   DULoxetine (CYMBALTA) 30 MG capsule [Pharmacy Med Name: DULOXETINE HCL DR 30 MG CAP] 90 capsule 1    Sig: TAKE 1 CAPSULE BY MOUTH EVERY DAY     Psychiatry: Antidepressants - SNRI - duloxetine Failed - 07/27/2022  8:31 AM      Failed - Completed PHQ-2 or PHQ-9 in the last 360 days      Passed - Cr in normal range and within 360 days    Creatinine, Ser  Date Value Ref Range Status  04/11/2022 0.67 0.44 - 1.00 mg/dL Final         Passed - eGFR is 30 or above and within 360 days    GFR, Estimated  Date Value Ref Range Status  04/11/2022 >60 >60 mL/min Final    Comment:    (NOTE) Calculated using the CKD-EPI Creatinine Equation (2021)          Passed - Last BP in normal range    BP Readings from Last 1 Encounters:  06/05/22 128/88         Passed - Valid encounter within last 6 months    Recent Outpatient Visits           1 month ago Impingement of left shoulder   [facility] [place] [facility] [place], Earley Abide, MD   3 months ago Nontraumatic pain and swelling of elbow   [place] [facility], Earley Abide, MD       Future Appointments             In 1 week Zigmund Daniel, Earley Abide, MD [place] [facility]

## 2022-08-06 ENCOUNTER — Telehealth: Payer: Self-pay | Admitting: Family Medicine

## 2022-08-06 NOTE — Telephone Encounter (Signed)
Patient wants to know how much their copay will be for their appointment tomorrow.

## 2022-08-07 ENCOUNTER — Ambulatory Visit: Payer: No Typology Code available for payment source | Admitting: Family Medicine

## 2022-08-08 ENCOUNTER — Ambulatory Visit: Payer: No Typology Code available for payment source | Admitting: Family Medicine

## 2022-08-15 ENCOUNTER — Ambulatory Visit: Payer: No Typology Code available for payment source | Admitting: Family Medicine

## 2022-08-20 ENCOUNTER — Encounter: Payer: Self-pay | Admitting: Family Medicine

## 2022-09-10 ENCOUNTER — Encounter: Payer: Self-pay | Admitting: Family Medicine

## 2022-10-21 ENCOUNTER — Ambulatory Visit: Payer: No Typology Code available for payment source | Admitting: Internal Medicine

## 2023-03-24 ENCOUNTER — Ambulatory Visit: Payer: Medicaid Other

## 2023-04-14 ENCOUNTER — Ambulatory Visit: Payer: Medicaid Other

## 2023-12-22 IMAGING — CR DG TOE GREAT 2+V*R*
3 series · 3 of 3 positions shown · non-contrast
Comparison: None.

CLINICAL DATA: Heavy stool fell on right great toe

EXAM:
RIGHT GREAT TOE

[toe ap]
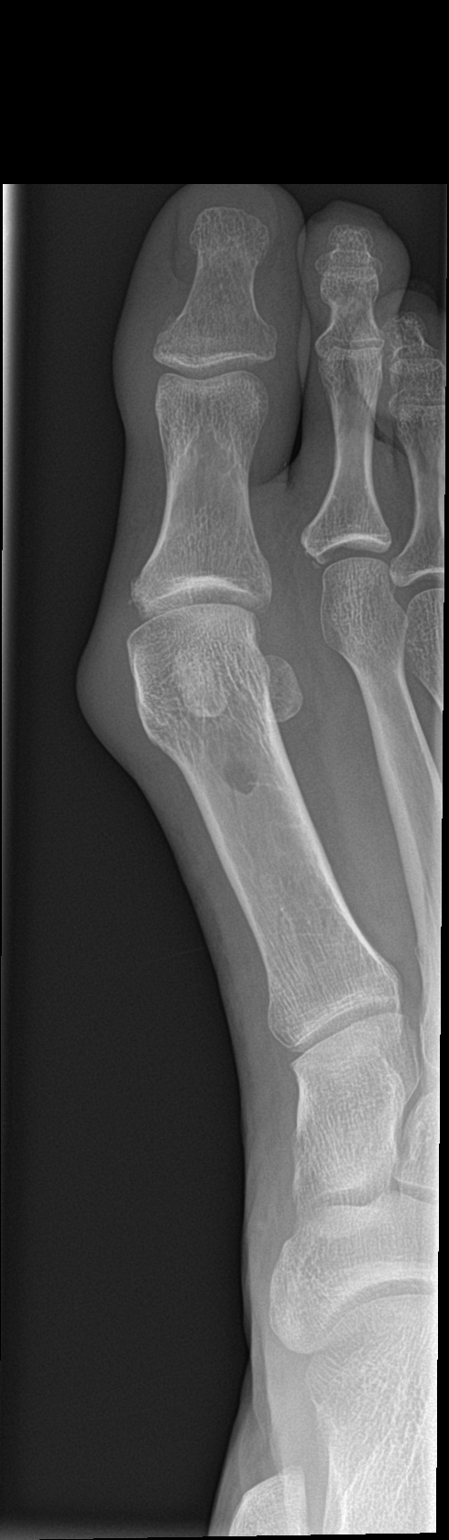

[toe obl]
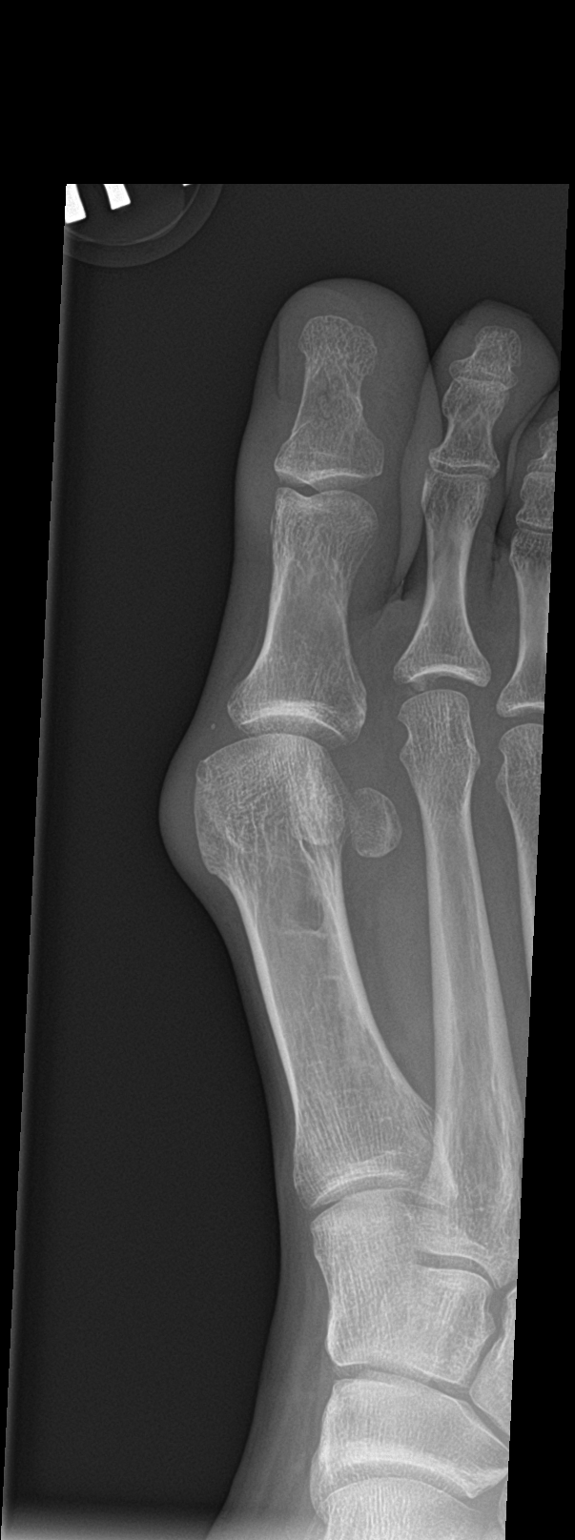

[toe lat]
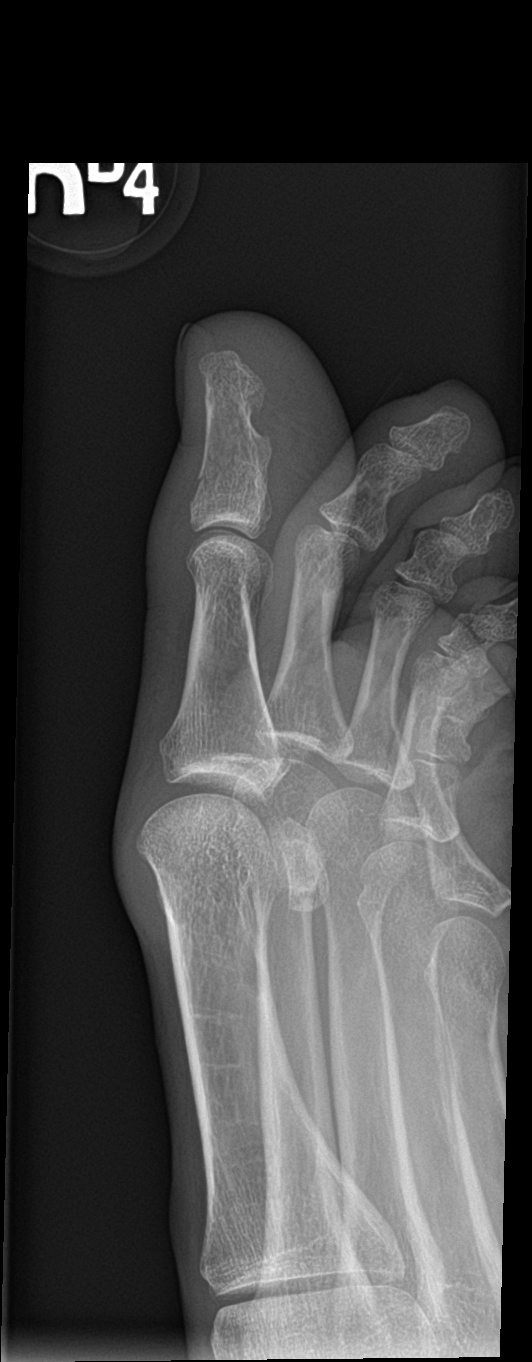

[3 of 3 positions shown; findings below may reference images not displayed]

FINDINGS: Minimally displaced fracture through the distal phalanx of the first
toe, which is only visualized on the lateral radiograph. There is no
evidence of arthropathy or other focal bone abnormality. Soft
tissues are unremarkable.
IMPRESSION: Minimally displaced fracture of the distal phalanx of the first toe.

## 2024-03-08 ENCOUNTER — Ambulatory Visit: Payer: Self-pay

## 2024-03-15 ENCOUNTER — Ambulatory Visit: Payer: Self-pay

## 2024-05-18 ENCOUNTER — Ambulatory Visit: Payer: Self-pay

## 2024-05-24 ENCOUNTER — Ambulatory Visit: Payer: Self-pay

## 2024-06-02 ENCOUNTER — Encounter: Payer: Self-pay | Admitting: Family Medicine

## 2024-06-02 ENCOUNTER — Ambulatory Visit: Payer: Self-pay

## 2024-06-02 VITALS — BP 143/87 | HR 84 | Wt 100.4 lb

## 2024-06-02 DIAGNOSIS — Z309 Encounter for contraceptive management, unspecified: Secondary | ICD-10-CM | POA: Diagnosis not present

## 2024-06-02 DIAGNOSIS — N898 Other specified noninflammatory disorders of vagina: Secondary | ICD-10-CM

## 2024-06-02 DIAGNOSIS — Z3009 Encounter for other general counseling and advice on contraception: Secondary | ICD-10-CM

## 2024-06-02 DIAGNOSIS — Z30017 Encounter for initial prescription of implantable subdermal contraceptive: Secondary | ICD-10-CM | POA: Diagnosis not present

## 2024-06-02 DIAGNOSIS — Z3046 Encounter for surveillance of implantable subdermal contraceptive: Secondary | ICD-10-CM

## 2024-06-02 DIAGNOSIS — Z01419 Encounter for gynecological examination (general) (routine) without abnormal findings: Secondary | ICD-10-CM

## 2024-06-02 DIAGNOSIS — B009 Herpesviral infection, unspecified: Secondary | ICD-10-CM

## 2024-06-02 LAB — WET PREP FOR TRICH, YEAST, CLUE
Clue Cell Exam: NEGATIVE
Trichomonas Exam: NEGATIVE
Yeast Exam: NEGATIVE

## 2024-06-02 MED ORDER — ETONOGESTREL 68 MG ~~LOC~~ IMPL
68.0000 mg | DRUG_IMPLANT | Freq: Once | SUBCUTANEOUS | Status: AC
  Administered 2024-06-02: 14:00:00 68 mg via SUBCUTANEOUS

## 2024-06-02 MED ORDER — ACYCLOVIR 800 MG PO TABS: 800.0000 mg | ORAL_TABLET | Freq: Two times a day (BID) | ORAL | 6 refills | Status: AC

## 2024-06-02 NOTE — Progress Notes (Signed)
 [facility] [address] [place] [address] Main phone: [phone]  Family Planning Visit - Repeat Yearly Visit  Subjective:  Kelly Hampton is a 43 y.o. [phone]  being seen today for an annual wellness visit and to discuss contraception options. The patient is currently using hormonal implant for pregnancy prevention. Patient does not want a pregnancy in the next year.   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   HPI Patient reports annual well woman exam and for removal and reinsertion of nexplanon . No changes in Medical or Surgical hx since nexplanon  was orginally placed.  Last pap was: 04/18/20. Nexplanon  placed 11//3/21. Looking for a PCP to manage Lupus. 6 months ago ~ around April/May had a HSV outbreak that went away on it's own, she is out of medication currently. She reports vaginal itching intermittently (comes and goes) that happened on Monday and Tuesday. Denies odor, vaginal discharge or discomfort. Declines STI testing at this time.    Review of Systems  Constitutional:  Negative for weight loss.  HENT: Negative.    Eyes: Negative.   Respiratory: Negative.    Cardiovascular: Negative.   Gastrointestinal: Negative.   Genitourinary: Negative.   Skin: Negative.   Neurological: Negative.   Psychiatric/Behavioral: Negative.      See flowsheet for further details and programmatic requirements Hyperlink available at the top of the signed note in blue.  Flow sheet content below:  Pregnancy Intention  Screening Does the patient want to become pregnant in the next year?: No Does the patient's partner want to become pregnant in the next year?: No Would the patient like to discuss contraceptive options today?: Yes Sexual History What age did you start your period?: 14 How often do you have your period?: no periods Date of last sex?:  (6 months ago) Has the patient had unprotected sex within the last 5 days?: No Do you have sex with men, women, both men and women?: Men only In the past 2 months how many partners have you had sex with?: 0 In the past 12 months, how many partners have you had sex with?: 1 Is it possible that any of your sex partners in the past 12 months had sex with someone else whild they were still in a sexual relationship with you?: No What ways do you have sex?: Vaginal Do you or your partner use condoms and/or dental dams every time you have vaginal, oral or anal sex?: Yes Do you douche?: Yes Have you ever had an STD?: Yes Have any of your partners had an STD?: Yes Partner Previous STD?: HSV Have you or your partner ever shot up drugs?: No Have any of your partners used drugs in the past?: No Have you or your partners exchanged money or drugs for sex?: No  Diabetes screening This patient is 44 y.o. with a BMI of Body mass index is 15.27 kg/m.[name]  Is patient eligible for diabetes screening (age >35 and BMI >25)?  not applicable  Was Hgb A1c ordered? not applicable  STI screening Patient reports 1 of partners in  last year.  Does this patient desire STI screening?  No - Not concerned for any STIs currently  Hepatitis C screening Has patient been screened once for HCV in the past?  No  No results found for: HCVAB  Does the patient meet criteria for HCV testing? No  (If yes-- Screen for HCV through [facility]) Criteria:  Since the last HCV result, does the patient have any of the following? - Current drug use - Have a partner with drug use - Has been  incarcerated  Hepatitis B screening Does the patient meet criteria for HBV testing? No Criteria:  -Household, sexual or needle sharing contact with HBV -History of drug use -HIV positive -Those with known Hep C  Cervical Cancer Screening  Result Date Procedure Results Follow-ups  04/18/2020 IGP, Aptima HPV DIAGNOSIS:: Comment Specimen adequacy:: Comment Clinician Provided ICD10: Comment Performed by:: Comment PAP Smear Comment: . Note:: Comment Test Methodology: Comment HPV Aptima: Negative   12/31/2013 HM PAP SMEAR HM Pap smear: Kelly Hampton [facility]     Health Maintenance Due  Topic Date Due   Hepatitis C Screening  Never done   DTaP/Tdap/Td (1 - Tdap) Never done   Pneumococcal Vaccine (1 of 2 - PCV) Never done   Hepatitis B Vaccines 19-59 Average Risk (1 of 3 - 19+ 3-dose series) Never done   HPV VACCINES (1 - 3-dose SCDM series) Never done   Mammogram  Never done   Influenza Vaccine  Never done   COVID-19 Vaccine (1 - 2025-26 season) Never done    The following portions of the patient's history were reviewed and updated as appropriate: allergies, current medications, past family history, past medical history, past social history, past surgical history and problem list. Problem list updated.  Objective:   Vitals:   06/02/24 1033  BP: (!) 143/87  Pulse: 84  Weight: 100 lb 6.4 oz (45.5 kg)    Physical Exam Vitals and nursing note reviewed. Exam conducted with a chaperone present Brett Orange, CMA).  Constitutional:      Appearance: Normal appearance.  HENT:     Head: Normocephalic.     Comments:   Macular rash sides of face and center of nose     Mouth/Throat:     Mouth: Mucous membranes are moist.  Cardiovascular:     Rate and Rhythm: Normal rate and regular rhythm.     Pulses: Normal pulses.     Heart sounds: Normal heart sounds.  Pulmonary:     Effort: Pulmonary effort is normal.     Breath sounds: Normal breath sounds.  Chest:     Comments: Pt declined  breast exam Abdominal:     General: Abdomen is flat.  Genitourinary:    Exam position: Lithotomy position.     Pubic Area: No rash or pubic lice.      Labia:        Right: No rash or tenderness.        Left: No rash or tenderness.      Comments: Vagina swab collected, declined speculum exam.  Musculoskeletal:        General: Normal range of motion.  Skin:    General: Skin is warm and dry.  Neurological:     Mental Status: She is alert. Mental status is at baseline.     Assessment and Plan:  Kelly Hampton is a 43 y.o. female [phone] presenting to the [facility] Department for an yearly wellness and contraception visit  Contraception  counseling:  Reviewed options based on patient desire and reproductive life plan. Patient is interested in Hormonal Implant. This was provided to the patient today.   Risks, benefits, and typical effectiveness rates were reviewed.  Questions were answered.  Written information was also given to the patient to review.    The patient will follow up in  1 years for surveillance.  The patient was told to call with any further questions, or with any concerns about this method of contraception.  Emphasized use of condoms 100% of the time for STI prevention.  Emergency Contraception Precautions (ECP): Patient assessed for need of ECP. She is not a candidate based on LARC in place and unexpired . (LARC actively in place, and unexpired during the last time she had sexual intercourse).  Educated on ECP and reviewed options.    1. Family planning (Primary) Contraception counseling:  Reviewed options based on patient desire and reproductive life plan. Patient is interested in Hormonal Implant. This was provided to the patient today.   Risks, benefits, and typical effectiveness rates were reviewed.  Questions were answered.  Written information was also given to the patient to review.    The patient will follow up in  1 years for  surveillance.  The patient was told to call with any further questions, or with any concerns about this method of contraception.  Emphasized use of condoms 100% of the time for STI prevention.  Emergency Contraception Precautions (ECP): Patient assessed for need of ECP. She is not a candidate based on LARC in place and unexpired . (LARC actively in place, and unexpired during the last time she had sexual intercourse).  Educated on ECP and reviewed options.   -PCP list provided for further management of SLE.   2. Well woman exam -Mammagram: BCCCP Discussed, Will call to give BCCCP Pamphlet information, not given at visit.  -CBE: checks breasts at home, declined breast exam -Pap: 04/18/20, NILM HPV Negative - acyclovir  (ZOVIRAX ) 800 MG tablet; Take 1 tablet (800 mg total) by mouth 2 (two) times daily for 5 days.  Dispense: 10 tablet; Refill: 6  3. Encounter for removal and reinsertion of Nexplanon  (Primary) Procedure:  Nexplanon  Removal and Insertion  Patient identified, informed consent performed, consent signed.   Patient does understand that irregular bleeding is a very common side effect of this medication. She was advised to have backup contraception for one week after replacement of the implant. Patient deemed to meet WHO criteria for being reasonably certain she is not pregnant.  Appropriate time out taken. Nexplanon  site identified in the patient's left arm. Area prepped in usual sterile fashon. 2 ml of 1% lidocaine with epinephrine was used to anesthetize the area at the distal end of the implant. A small stab incision was made right beside the implant on the distal portion. The Nexplanon  rod was grasped using hemostats and removed without difficulty. There was minimal blood loss. There were no complications.   Confirmed correct location of insertion site. The insertion site was identified 8-10 cm (3-4 inches) from the medial epicondyle of the humerus and 3-5 cm (1.25-2 inches) posterior to  (below) the sulcus (groove) between the biceps and triceps muscles of the patient's left arm. New Nexplanon  removed from packaging, device confirmed in needle, then inserted full length of needle and withdrawn per handbook instructions. Nexplanon  was able to palpated in the patient's arm; patient palpated the insert herself.  There was minimal blood loss. Patient insertion site covered with guaze and a  pressure bandage to reduce any bruising. The patient tolerated the procedure well and was given post procedure instructions.     Nexplanon :   Counseled patient to take OTC analgesic starting as soon as lidocaine starts to wear off and take regularly for at least 48 hr to decrease discomfort.  Specifically to take with food or milk to decrease stomach upset and for IB 600 mg (3 tablets) every 6 hrs; IB 800 mg (4 tablets) every 8 hrs; or Aleve 2 tablets every 12 hrs.   -Nexplanon  Removed By: Verneta Bers, FNP -Nexplanon  Inserted by: Hardin Pouch, WHNP  4. HSV-2 infection -Acyclovir  refilled for episodic therapy to Pharmacy on file.  - acyclovir  (ZOVIRAX ) 800 MG tablet; Take 1 tablet (800 mg total) by mouth 2 (two) times daily for 5 days.  Dispense: 10 tablet; Refill: 6  5. Vaginal itching -Wet prep negative. - WET PREP FOR TRICH, YEAST, CLUE   No follow-ups on file.  Future Appointments  Date Time Provider Department Center  06/02/2024 11:10 AM AC-FP PROVIDER AC-FAM None    Nuchem Grattan GORMAN Pouch, NP  Attestation of Supervision of Advanced Practitioner (CNM/PA/NP): Evaluation and management procedures were performed by the Advanced Practice Provider under my supervision and collaboration.  I have reviewed the Advanced Practice Provider's note and chart, and I agree with the management and plan. I have also made any necessary editorial changes.   I was working along side this practitioner all day and all medical plans were discussed with me.   Verneta Bers, [place]

## 2024-06-02 NOTE — Progress Notes (Signed)
 Pt is here for Nexplanon  removal and STD testing. Wet prep review with patient and requires no treatment per provider. Nexplanon  removed from Lt arm and a new implant inserted successfully into the Lt Arm by Hardin Pouch, [facility]. Pt tolerated well to the removal and reinsertion process with no complications. Opportunity given to pt to ask questions for any clarifications. Questions Answered. FP card given. Wilkie Drought, RN.

## 2024-06-02 NOTE — Progress Notes (Signed)
 Please see other encounter with note from the same date.

## 2024-07-06 ENCOUNTER — Ambulatory Visit: Payer: Self-pay

## 2024-07-06 VITALS — BP 140/96 | HR 114 | Ht 68.0 in | Wt 106.4 lb

## 2024-07-06 DIAGNOSIS — R634 Abnormal weight loss: Secondary | ICD-10-CM

## 2024-07-06 DIAGNOSIS — Z681 Body mass index (BMI) 19 or less, adult: Secondary | ICD-10-CM

## 2024-07-06 DIAGNOSIS — J309 Allergic rhinitis, unspecified: Secondary | ICD-10-CM

## 2024-07-06 DIAGNOSIS — R61 Generalized hyperhidrosis: Secondary | ICD-10-CM

## 2024-07-06 DIAGNOSIS — F17210 Nicotine dependence, cigarettes, uncomplicated: Secondary | ICD-10-CM

## 2024-07-06 DIAGNOSIS — L932 Other local lupus erythematosus: Secondary | ICD-10-CM

## 2024-07-06 DIAGNOSIS — M329 Systemic lupus erythematosus, unspecified: Secondary | ICD-10-CM

## 2024-07-06 DIAGNOSIS — R636 Underweight: Secondary | ICD-10-CM | POA: Insufficient documentation

## 2024-07-06 MED ORDER — ESCITALOPRAM OXALATE 5 MG PO TABS: 5.0000 mg | ORAL_TABLET | Freq: Every day | ORAL | 1 refills | Status: AC

## 2024-07-06 MED ORDER — FLUTICASONE PROPIONATE 50 MCG/ACT NA SUSP: 2.0000 | Freq: Every day | NASAL | 6 refills | Status: AC

## 2024-07-06 NOTE — Addendum Note (Signed)
 Addended by: Javone Ybanez A on: 07/06/2024 11:16 AM   Modules accepted: Orders

## 2024-07-06 NOTE — Progress Notes (Signed)
 -   New Patient Visit   Physician: Rai Sinagra A Denali Becvar, MD  Patient: Kelly Hampton   DOB: [birth year]   44 y.o. Female  MRN: [id number] Visit Date: 07/06/2024   Chief Complaint  Patient presents with   Establish Care   Subjective  Kelly Hampton is a 44 y.o. female who presents today as a new patient to establish care.  No outside records were available for review at time of visit (prior notes/imaging/labs from outside facilities not received). Assessment and plan based on patient report and information available in Epic today.  HPI  Transcription with the patient, who gave verbal consent to proceed.  History of Present Illness   Kelly Hampton is a 44 year old female with lupus who presents with significant weight loss and anxiety.  Unintentional weight loss - Significant weight loss since a severe flu illness around Christmas, fatigue - Weight has remained low since illness - BMI of 16.   - Denies anorexia; eats three meals daily - reports night sweats for the last several months - Occasionally uses THC edibles to stimulate appetite  - Night sweats severe with soaked shirt requiring change of clothing  Anxiety and depression - History of anxiety and depression, worsened by personal losses (mother in 2009, oldest son in 2022, father in 2024) - Feels anxious, especially when driving - Prefers to isolate at home - Previously prescribed mirtazapine for depression and appetite stimulation, not currently on any psychiatric medication.  Denies suicidal ideation  Systemic lupus erythematosus symptoms - Diagnosed with lupus in 2013 - No rheumatology follow-up since 2015-2016 - Uses over-the-counter healing ointment for skin symptoms; prescribed steroid creams were ineffective - Denies organ involvement (no lung or kidney symptoms) - No records are available whatsoever and so the status of the condition is unknown  Arthralgia and swelling - History of  arthritis - Recent swelling in elbows, which has since resolved  Allergic rhinitis - Sinus congestion with morning nasal discharge - Occasional nausea associated with congestion - Previously treated with Zyrtec   Substance use and supplements - Smokes cigarettes since age 66 1 PPD - Drinks alcohol socially - Occasionally uses THC edibles for appetite stimulation - Denies illicit drug use         ASSESSMENT & PLAN  Encounter Diagnoses  Name Primary?   Weight loss Yes   Night sweats    Systemic lupus erythematosus, unspecified SLE type, unspecified organ involvement status (HCC)    Underweight (BMI < 18.5)    Cigarette smoker    Cutaneous lupus erythematosus     Orders Placed This Encounter  Procedures   CT Chest Wo Contrast   C-reactive protein   CBC with Differential/Platelet   Comprehensive metabolic panel with GFR   B12 and Folate Panel   TSH + free T4   Iron, TIBC and Ferritin Panel   Lipid panel   VITAMIN D  25 Hydroxy (Vit-D Deficiency, Fractures)   Magnesium   Urinalysis, Routine w reflex microscopic   Lactate dehydrogenase   HIV Antibody (routine testing w rflx)   Hepatitis B core antibody, IgM   Hepatitis C antibody, reflex   Hepatitis B surface antigen   Hepatitis B surface antibody,qualitative   Ambulatory referral to Rheumatology    Assessment and Plan  #1 unintentional weight loss.  Concerning given smoking duration.  Not sure whether there is a systemic lupus as a contributing factor here.  Overall health and bone density is at risk.  We ordered blood test  for calcium vitamin D  B12 folate.  Will put in for HIV and hepatitis screening.  Basic labs including CBC CMP LDH.  Will order chest CT.  2.  Lupus-cutaneous versus systemic.  Impossible to determine status of this disease since would lack adequate medical records.  Given that patient has been seen in our system for several months the reason for this is not clear.  Need to obtain outside records on  this patient regarding treatment and status of lupus.  3.  Depression and anxiety.  Will start patient on Lexapro  5 mg daily.  Given her low BMI low-dose will be better for her in terms of side effects.  Discussed activity and trying to get outside where possible.  Plan for us  to follow-up on this issue in about 10 days.  4.  Chronic rhinitis.  Will prescribe Flonase  to use in the evenings.  5.  Tobacco abuse.  Patient smoking for a number of years.  She should have baseline chest CT particularly in the setting of night sweats.  She denies any shortness of breath.  Will have this patient follow-up in 7 to 10 days.  Follow-up if any other concerns.  Her overall health situation is somewhat concerning given her BMI and presentation. Lost to follow-up.       Objective  BP (!) 140/96 (BP Location: Right Arm, Patient Position: Sitting, Cuff Size: Normal)   Pulse (!) 114   Ht 5' 8 (1.727 m)   Wt 106 lb 6.4 oz (48.3 kg)   LMP  (LMP Unknown)   SpO2 98%   BMI 16.18 kg/m      Review of Systems  Constitutional:  Negative for chills, fever and weight loss.  Eyes:  Negative for blurred vision. h Respiratory:  Negative for cough and shortness of breath.   Cardiovascular:  Negative for chest pain and palpitations.  Skin:  Negative for rash.  Psychiatric/Behavioral:  Negative for depression. The patient is not nervous/anxious.      Physical Exam Physical Exam Vitals reviewed.  Constitutional:      Appearance  - Appears chronically ill HENT:     Head: Normocephalic and atraumatic.  Normal mucous membranes, no oral lesions Eyes:     Pupils: Pupils are equal, round, and reactive to light.  Neck:     Thyroid: No thyroid mass or thyromegaly.  Cardiovascular:     Rate and Rhythm: Normal rate and regular rhythm. Normal heart sounds. Normal peripheral pulses Pulmonary:     Normal breath sounds with normal effort Abdominal:   Abdomen is soft, without tenderness or noted  hepatosplenomegaly Musculoskeletal:        General: No swelling or edema  Lymphadenopathy:     Cervical: No cervical adenopathy.  Skin:    General: Skin is warm and dry without noticeable rash. Neurological:     General: No focal deficit present.  Psychiatric:        Mood and Affect: Mood, behavior and cognition anxious, tearful  Past Medical History:  Diagnosis Date   Depression    Herpes simplex virus (HSV) infection    Impingement of left shoulder 04/24/2022   Lupus    Systemic lupus erythematosus (HCC)    Past Surgical History:  Procedure Laterality Date   MANDIBLE SURGERY     Family Status  Relation Name Status   Mother  Deceased   Father  Deceased   Sister  (Not Specified)   Niece  Other       borderline diabetic  per pt  No partnership data on file   Family History  Problem Relation Age of Onset   Cancer Mother    Lupus Mother    Thyroid disease Mother    Hypertension Father    Heart murmur Father    Anxiety disorder Sister    Sickle cell trait Niece    Social History   Socioeconomic History   Marital status: Single    Spouse name: Not on file   Number of children: Not on file   Years of education: Not on file   Highest education level: Not on file  Occupational History   Not on file  Tobacco Use   Smoking status: Every Day    Current packs/day: 0.50    Types: Cigarettes   Smokeless tobacco: Never  Vaping Use   Vaping status: Never Used  Substance and Sexual Activity   Alcohol use: Yes    Comment: social   Drug use: Not Currently   Sexual activity: Not Currently    Partners: Male    Birth control/protection: Implant  Other Topics Concern   Not on file  Social History Narrative   Not on file   Social Drivers of Health   Tobacco Use: High Risk (06/02/2024)   Patient History    Smoking Tobacco Use: Every Day    Smokeless Tobacco Use: Never    Passive Exposure: Not on file  Financial Resource Strain: Not on file  Food Insecurity: Not  on file  Transportation Needs: Not on file  Physical Activity: Not on file  Stress: Not on file  Social Connections: Not on file  Depression (PHQ2-9): Medium Risk (06/02/2024)   Depression (PHQ2-9)    PHQ-2 Score: 10  Alcohol Screen: Not on file  Housing: Not on file  Utilities: Not on file  Health Literacy: Not on file   Show/hide medication list[1] Allergies[2]  Immunization History  Administered Date(s) Administered   Td 07/31/1997   Td (Adult),unspecified 04/30/2012   Tdap 03/14/2008    Health Maintenance  Topic Date Due   Hepatitis C Screening  Never done   Pneumococcal Vaccine (1 of 2 - PCV) Never done   Hepatitis B Vaccines 19-59 Average Risk (1 of 3 - 19+ 3-dose series) Never done   Mammogram  Never done   DTaP/Tdap/Td (4 - Td or Tdap) 04/30/2022   COVID-19 Vaccine (1 - 2025-26 season) 07/22/2024 (Originally 02/15/2024)   Influenza Vaccine  09/13/2024 (Originally 01/15/2024)   Cervical Cancer Screening (HPV/Pap Cotest)  04/18/2025   HPV VACCINES (No Doses Required) Completed   HIV Screening  Completed   Meningococcal B Vaccine  Aged Out    Patient Care Team: Alvia Selinda PARAS, MD as PCP - General (Family Medicine)  Depression Screen    06/02/2024   10:41 AM 04/18/2020   10:35 AM  PHQ 2/9 Scores  PHQ - 2 Score 2 2  PHQ- 9 Score 10      Kelly DELENA Juneau, MD  [facility] [phone] (phone) [phone] (fax)  [facility]    [1]  Outpatient Medications Prior to Visit  Medication Sig   etonogestrel  (NEXPLANON ) 68 MG IMPL implant 1 each by Subdermal route once.   [DISCONTINUED] cetirizine  (ZYRTEC  ALLERGY) 10 MG tablet Take 1 tablet (10 mg total) by mouth daily.   [DISCONTINUED] gabapentin  (NEURONTIN ) 100 MG capsule Take 1 capsule (100 mg total) by mouth at bedtime.   [DISCONTINUED] meloxicam  (MOBIC ) 15 MG tablet Take 1 tablet (15  mg total) by mouth daily.   [DISCONTINUED] methocarbamol  (ROBAXIN ) 500 MG  tablet Take 1 tablet (500 mg total) by mouth at bedtime as needed for muscle spasms.   [DISCONTINUED] DULoxetine  (CYMBALTA ) 30 MG capsule Take 1 capsule (30 mg total) by mouth daily.   No facility-administered medications prior to visit.  [2] No Known Allergies

## 2024-07-07 ENCOUNTER — Ambulatory Visit: Payer: Self-pay

## 2024-07-07 ENCOUNTER — Other Ambulatory Visit: Payer: Self-pay

## 2024-07-07 LAB — CBC WITH DIFFERENTIAL/PLATELET
Absolute Lymphocytes: 1490 {cells}/uL (ref 850–3900)
Absolute Monocytes: 205 {cells}/uL (ref 200–950)
Basophils Absolute: 10 {cells}/uL (ref 0–200)
Basophils Relative: 0.4 %
Eosinophils Absolute: 0 {cells}/uL — ABNORMAL LOW (ref 15–500)
Eosinophils Relative: 0 %
HCT: 30.9 % — ABNORMAL LOW (ref 35.9–46.0)
Hemoglobin: 9.6 g/dL — ABNORMAL LOW (ref 11.7–15.5)
MCH: 27.3 pg (ref 27.0–33.0)
MCHC: 31.1 g/dL — ABNORMAL LOW (ref 31.6–35.4)
MCV: 87.8 fL (ref 81.4–101.7)
MPV: 10.5 fL (ref 7.5–12.5)
Monocytes Relative: 8.2 %
Neutro Abs: 795 {cells}/uL — ABNORMAL LOW (ref 1500–7800)
Neutrophils Relative %: 31.8 %
Platelets: 197 Thousand/uL (ref 140–400)
RBC: 3.52 Million/uL — ABNORMAL LOW (ref 3.80–5.10)
RDW: 16.6 % — ABNORMAL HIGH (ref 11.0–15.0)
Total Lymphocyte: 59.6 %
WBC: 2.5 Thousand/uL — ABNORMAL LOW (ref 3.8–10.8)

## 2024-07-07 LAB — URINALYSIS, ROUTINE W REFLEX MICROSCOPIC
Bilirubin Urine: NEGATIVE
Glucose, UA: NEGATIVE
Hgb urine dipstick: NEGATIVE
Ketones, ur: NEGATIVE
Leukocytes,Ua: NEGATIVE
Nitrite: NEGATIVE
Protein, ur: NEGATIVE
Specific Gravity, Urine: 1.004 (ref 1.001–1.035)
pH: 7 (ref 5.0–8.0)

## 2024-07-07 LAB — HEPATITIS B CORE ANTIBODY, IGM: Hep B C IgM: NONREACTIVE

## 2024-07-07 LAB — TSH+FREE T4: TSH W/REFLEX TO FT4: 0.34 m[IU]/L — ABNORMAL LOW

## 2024-07-07 LAB — IRON,TIBC AND FERRITIN PANEL
%SAT: 93 % — ABNORMAL HIGH (ref 16–45)
Ferritin: 576 ng/mL — ABNORMAL HIGH (ref 16–232)
Iron: 576 ug/dL — ABNORMAL HIGH (ref 40–232)
TIBC: 242 ug/dL — ABNORMAL LOW (ref 250–450)

## 2024-07-07 LAB — HIV ANTIBODY (ROUTINE TESTING W REFLEX)
HIV 1&2 Ab, 4th Generation: NONREACTIVE
HIV FINAL INTERPRETATION: NEGATIVE

## 2024-07-07 LAB — HEPATITIS C ANTIBODY: Hepatitis C Ab: NONREACTIVE

## 2024-07-07 LAB — VITAMIN D 25 HYDROXY (VIT D DEFICIENCY, FRACTURES): Vit D, 25-Hydroxy: 32 ng/mL (ref 30–100)

## 2024-07-07 LAB — MAGNESIUM: Magnesium: 1.9 mg/dL (ref 1.5–2.5)

## 2024-07-07 LAB — B12 AND FOLATE PANEL
Folate: 2.1 ng/mL — ABNORMAL LOW
Vitamin B-12: 339 pg/mL (ref 200–1100)

## 2024-07-07 LAB — T4, FREE: Free T4: 1.1 ng/dL (ref 0.8–1.8)

## 2024-07-07 LAB — C-REACTIVE PROTEIN: CRP: <3 mg/L

## 2024-07-07 LAB — HEPATITIS B SURFACE ANTIGEN: Hepatitis B Surface Ag: NONREACTIVE

## 2024-07-08 ENCOUNTER — Telehealth: Payer: Self-pay

## 2024-07-08 NOTE — Progress Notes (Signed)
 Complex Care Management Note  Care Guide Note 07/08/2024 Name: [name] MRN: [id number] DOB: 1980/06/20  Kelly Hampton is a 44 y.o. year old female who sees Zafirov, Parris LABOR, MD for primary care. I reached out to Kelly Hampton by phone today to offer complex care management services.  [name] was given information about Complex Care Management services today including:   The Complex Care Management services include support from the care team which includes your Nurse Care Manager, Clinical Social Worker, or Pharmacist.  The Complex Care Management team is here to help remove barriers to the health concerns and goals most important to you. Complex Care Management services are voluntary, and the patient may decline or stop services at any time by request to their care team member.   Complex Care Management Consent Status: Patient agreed to services and verbal consent obtained.   Follow up plan:  Telephone appointment with complex care management team member scheduled for:  [facility] 08/02/2024 BSW 07/12/2024  Encounter Outcome:  Patient Scheduled  Jeoffrey Buffalo , RMA     [place]  [facility], [facility] Guide  Direct Dial: [phone]  Website: [email or web address]

## 2024-07-12 ENCOUNTER — Other Ambulatory Visit: Payer: Self-pay

## 2024-07-12 NOTE — Patient Instructions (Signed)
 Visit Information  Thank you for taking time to visit with me today. Please don't hesitate to contact me if I can be of assistance to you before our next scheduled appointment.  Our next appointment is by telephone on 07/22/24 at 930 Please call the care guide team at [phone] if you need to cancel or reschedule your appointment.   Following is a copy of your care plan:   Goals Addressed             This Visit's Progress    BSW Goals       Current SDOH Barriers:  Financial constraints related to no income Housing barriers Rent and utilities  Interventions: Referred patient to community resources  SW provided patient with resources for food, rent, and utilities.   Thersia Hoar, BSW, MHA [place]  [facility] Social Worker, Population Health [phone]           Please call the Suicide and Crisis Lifeline: 988 call the [place]  National Suicide Prevention Lifeline: (567) 109-2544 or TTY: 810-206-8734 TTY 510-138-3543) to talk to a trained counselor call 1-800-273-TALK (toll free, 24 hour hotline) call 911 if you are experiencing a Mental Health or Behavioral Health Crisis or need someone to talk to.  Patient verbalized understanding of Care plan and visit instructions communicated this visit  Thersia Hoar, BSW, [facility]   [facility] Social Worker, Population Health [phone]

## 2024-07-12 NOTE — Patient Outreach (Signed)
 Social Drivers of Health  Community Resource and Care Coordination Visit Note   07/12/2024  Name: Kelly Hampton MRN: [id number] DOB:[birth year]  Situation: Referral received for [facility] needs assessment and assistance related to Financial Strain  Food Insecurity  rent, utilities, and dental. I obtained verbal consent from Patient.  Visit completed with Patient on the phone.   Background:   SDOH Interventions Today    Flowsheet Row Most Recent Value  SDOH Interventions   Food Insecurity Interventions Community Resources Provided  Housing Interventions Community Resources Provided  Utilities Interventions Community Resources Provided     Assessment:   Goals Addressed             This Visit's Progress    BSW Goals       Current SDOH Barriers:  Financial constraints related to no income Housing barriers Rent and utilities  Interventions: Referred patient to community resources  SW provided patient with resources for food, rent, and utilities.   Thersia Hoar, HEDWIG, MHA [place]  [facility] Social Worker, Population Health [phone]           Recommendation:   Use resources Sw provided for assistance with a dental appt, food, rent and utilities.  Follow Up Plan:   Telephone follow-up 07/22/24 at 930am  Thersia Hoar, BSW, [facility]  Value Based [facility] Social Worker, Population Health [phone]

## 2024-07-15 ENCOUNTER — Ambulatory Visit: Payer: Self-pay

## 2024-07-19 ENCOUNTER — Ambulatory Visit: Payer: Self-pay

## 2024-07-21 ENCOUNTER — Ambulatory Visit: Payer: Self-pay

## 2024-07-22 ENCOUNTER — Other Ambulatory Visit: Payer: Self-pay

## 2024-07-22 NOTE — Patient Instructions (Signed)
 Visit Information  Thank you for taking time to visit with me today. Please don't hesitate to contact me if I can be of assistance to you before our next scheduled appointment.  Your next care management appointment is by telephone on 08/04/24 at 11am    Please call the care guide team at [phone] if you need to cancel, schedule, or reschedule an appointment.   Please call the Suicide and Crisis Lifeline: 988 call the [place]  National Suicide Prevention Lifeline: 737 194 5359 or TTY: (907)848-2378 TTY 223-522-0429) to talk to a trained counselor call 1-800-273-TALK (toll free, 24 hour hotline) call 911 if you are experiencing a Mental Health or Behavioral Health Crisis or need someone to talk to.  Thersia Hoar, HEDWIG, MHA [place]  [facility] Social Worker, Population Health [phone]

## 2024-07-22 NOTE — Patient Outreach (Signed)
 Social Drivers of Health  Community Resource and Care Coordination Visit Note   07/22/2024  Name: Rachelle Edwards MRN: [id number] DOB:1981-05-01  Situation: Referral received for [facility] needs assessment and assistance related to Housing  Financial [employer] Insecurity  rent and utilties. I obtained verbal consent from Patient.  Visit completed with Patient on the phone.   Background:      Assessment:   Goals Addressed             This Visit's Progress    BSW Goals       Current SDOH Barriers:  Financial constraints related to no income Housing barriers Rent and utilities  Interventions: Referred patient to community resources  SW provided patient with resources for food, rent, and utilities. SW resent resources for food, rent and utilities. SW also added information on getting dog signed up to be a service dog.   Thersia Hoar, BSW, MHA [place]  [facility] Social Worker, Population Health [phone]           Recommendation:   follow up with resources regarding housing needs call and/or follow up with resources for food assistance Use resources to start the process for service dog registration.  Follow Up Plan:   Telephone follow-up 07/17/24 at 11am  Thersia Hoar, BSW, [facility] [place]  Value Based [facility] Social Worker, Population Health [phone]

## 2024-07-26 ENCOUNTER — Ambulatory Visit: Payer: Self-pay | Admitting: Internal Medicine

## 2024-08-02 ENCOUNTER — Telehealth: Payer: Self-pay

## 2024-08-04 ENCOUNTER — Telehealth: Payer: Self-pay

## 2024-08-25 ENCOUNTER — Ambulatory Visit: Payer: Self-pay
